# Patient Record
Sex: Female | Born: 1975
Health system: Southern US, Community
[De-identification: ages and names within clinical notes are randomized; demographics above are authoritative.]

## PROBLEM LIST (undated history)

## (undated) DIAGNOSIS — F988 Other specified behavioral and emotional disorders with onset usually occurring in childhood and adolescence: Secondary | ICD-10-CM

## (undated) DIAGNOSIS — E039 Hypothyroidism, unspecified: Secondary | ICD-10-CM

## (undated) DIAGNOSIS — F319 Bipolar disorder, unspecified: Secondary | ICD-10-CM

## (undated) DIAGNOSIS — F419 Anxiety disorder, unspecified: Secondary | ICD-10-CM

## (undated) DIAGNOSIS — F329 Major depressive disorder, single episode, unspecified: Secondary | ICD-10-CM

## (undated) DIAGNOSIS — I10 Essential (primary) hypertension: Secondary | ICD-10-CM

## (undated) DIAGNOSIS — F32A Depression, unspecified: Secondary | ICD-10-CM

## (undated) DIAGNOSIS — E7800 Pure hypercholesterolemia, unspecified: Secondary | ICD-10-CM

## (undated) HISTORY — DX: Hypothyroidism, unspecified: E03.9

## (undated) HISTORY — DX: Anxiety disorder, unspecified: F41.9

## (undated) HISTORY — PX: HAND EXPLORATION: SHX1725

## (undated) HISTORY — PX: CERVIX SURGERY: SHX593

## (undated) HISTORY — DX: Essential (primary) hypertension: I10

## (undated) HISTORY — DX: Depression, unspecified: F32.A

## (undated) HISTORY — PX: MANDIBLE SURGERY: SHX707

## (undated) HISTORY — DX: Bipolar disorder, unspecified: F31.9

## (undated) HISTORY — DX: Other specified behavioral and emotional disorders with onset usually occurring in childhood and adolescence: F98.8

## (undated) HISTORY — PX: APPENDECTOMY: SHX54

## (undated) HISTORY — DX: Major depressive disorder, single episode, unspecified: F32.9

---
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
## 2019-01-18 ENCOUNTER — Encounter: Payer: Self-pay | Admitting: Gastroenterology

## 2019-03-22 ENCOUNTER — Ambulatory Visit (INDEPENDENT_AMBULATORY_CARE_PROVIDER_SITE_OTHER): Payer: Medicaid Other | Admitting: Gastroenterology

## 2019-03-22 ENCOUNTER — Encounter: Payer: Self-pay | Admitting: Gastroenterology

## 2019-03-22 ENCOUNTER — Other Ambulatory Visit: Payer: Self-pay

## 2019-03-22 DIAGNOSIS — K625 Hemorrhage of anus and rectum: Principal | ICD-10-CM

## 2019-03-22 DIAGNOSIS — K5900 Constipation, unspecified: Secondary | ICD-10-CM

## 2019-03-22 DIAGNOSIS — R945 Abnormal results of liver function studies: Secondary | ICD-10-CM

## 2019-03-22 DIAGNOSIS — R7989 Other specified abnormal findings of blood chemistry: Secondary | ICD-10-CM

## 2019-03-22 NOTE — Progress Notes (Signed)
CC'ED TO PCP 

## 2019-03-22 NOTE — Progress Notes (Signed)
Primary Care Physician:  Rebecka Apley, NP Primary GI:  Roetta Sessions, MD    Patient Location: Home  Provider Location: [facility] office  Reason for Visit: constipation  Persons present on the virtual encounter, with roles: Patient, myself Linford Arnold, LPN (updated meds and allergies)  Total time (minutes) spent on medical discussion: 25 minutes  Due to COVID-19, visit was conducted using [email or web address] method.  Visit was requested by patient.  Virtual Visit via [email or web address]  I connected with Janyth Pupa on 03/22/19 at 10:00 AM EDT by [email or web address] and verified that I am speaking with the correct person using two identifiers.   I discussed the limitations, risks, security and privacy concerns of performing an evaluation and management service by telephone/video and the availability of in person appointments. I also discussed with the patient that there may be a patient responsible charge related to this service. The patient expressed understanding and agreed to proceed.  Chief Complaint  Patient presents with  . Constipation    HPI:   Zuzu Befort is a 43 y.o. female who presents for virtual visit regarding constipation, rectal bleeding at the request of Sharon Seller, NP.  Patient complains of chronic constipation for years.  She has been having frequent bright red blood per rectum for the past 2 years.  Currently on Linzess daily.  Some days has frequently stools, other days does okay.  Does not want to change dose. No abdominal pain. No heartburn. Appetite too good. No vomiting.  No weight loss.  Reports weighing around 210 pounds.  She drinks alcohol about 3 times per week.  May consume 7 to 8 twelve ounce beers at a time.  Recent labs showed mild elevation of ALT.  She denies previous viral markers. No blood transfusion. No tattoos. No drug use.  No family history of liver disease or colon cancer.  No prior colonoscopy.  Current Outpatient Medications   Medication Sig Dispense Refill  . ALPRAZolam (XANAX XR) 1 MG 24 hr tablet Take 1 mg by mouth daily.    Marland Kitchen ALPRAZolam (XANAX) 1 MG tablet Take 1 tablet by mouth as needed.    . desvenlafaxine (PRISTIQ) 100 MG 24 hr tablet Take 1 tablet by mouth 2 (two) times daily.    Marland Kitchen levothyroxine (SYNTHROID) 112 MCG tablet Take 1 tablet by mouth daily.    Marland Kitchen linaclotide (LINZESS) 145 MCG CAPS capsule Take 1 capsule by mouth daily.    Marland Kitchen lisinopril-hydrochlorothiazide (ZESTORETIC) 20-12.5 MG tablet Take 1 tablet by mouth daily.    . QUEtiapine (SEROQUEL) 100 MG tablet Take 600 mg by mouth daily.    . simvastatin (ZOCOR) 20 MG tablet Take 1 tablet by mouth daily.    . Vitamin D, Ergocalciferol, (DRISDOL) 1.25 MG (50000 UT) CAPS capsule Take 1 capsule by mouth once a week.     No current facility-administered medications for this visit.     Past Medical History:  Diagnosis Date  . ADD (attention deficit disorder)   . Anxiety   . Bipolar 1 disorder (HCC)   . Depression   . HTN (hypertension)   . Hypothyroidism     Past Surgical History:  Procedure Laterality Date  . CERVIX SURGERY    . MANDIBLE SURGERY      Family History  Problem Relation Age of Onset  . Breast cancer Mother   . Liver disease Neg Hx   . Colon cancer Neg Hx     Social History   Socioeconomic History  .  Marital status: Not on file    Spouse name: Not on file  . Number of children: Not on file  . Years of education: Not on file  . Highest education level: Not on file  Occupational History  . Not on file  Social Needs  . Financial resource strain: Not on file  . Food insecurity:    Worry: Not on file    Inability: Not on file  . Transportation needs:    Medical: Not on file    Non-medical: Not on file  Tobacco Use  . Smoking status: Current Every Day Smoker    Types: Cigarettes  . Smokeless tobacco: Never Used  Substance and Sexual Activity  . Alcohol use: Yes    Comment: 3 times/weekly. 7-8 12 ounces beers  .  Drug use: Not Currently  . Sexual activity: Not on file  Lifestyle  . Physical activity:    Days per week: Not on file    Minutes per session: Not on file  . Stress: Not on file  Relationships  . Social connections:    Talks on phone: Not on file    Gets together: Not on file    Attends religious service: Not on file    Active member of club or organization: Not on file    Attends meetings of clubs or organizations: Not on file    Relationship status: Not on file  . Intimate partner violence:    Fear of current or ex partner: Not on file    Emotionally abused: Not on file    Physically abused: Not on file    Forced sexual activity: Not on file  Other Topics Concern  . Not on file  Social History Narrative  . Not on file      ROS:  General: Negative for anorexia, weight loss, fever, chills, fatigue, weakness. Eyes: Negative for vision changes.  ENT: Negative for hoarseness, difficulty swallowing , nasal congestion. CV: Negative for chest pain, angina, palpitations, dyspnea on exertion, peripheral edema.  Respiratory: Negative for dyspnea at rest, dyspnea on exertion, cough, sputum, wheezing.  GI: See history of present illness. GU:  Negative for dysuria, hematuria, urinary incontinence, urinary frequency, nocturnal urination.  MS: Negative for joint pain, low back pain.  Derm: Negative for rash or itching.  Neuro: Negative for weakness, abnormal sensation, seizure, frequent headaches, memory loss, confusion.  Psych: Negative for anxiety, depression, suicidal ideation, hallucinations.  Endo: Negative for unusual weight change.  Heme: Negative for bruising or bleeding. Allergy: Negative for rash or hives.   Observations/Objective: Pleasant well-nourished well-built Caucasian female in no acute distress.  She reports weighing 210 pounds.  In February she weighed 216 pounds at her PCP office.  Otherwise exam unavailable.  Labs from 01/07/2019: Glucose 99, BUN 9, creatinine  0.79, sodium 137, potassium 4.4, albumin 4.5, total bilirubin 0.3, alkaline phosphatase 110, AST 26, ALT 37, A1c 5.6, TSH 3.5.  Labs from August 2019, hemoglobin 14.3, hematocrit 42.4, platelets 3 54,000, white blood cell count 7400  Assessment and Plan: Pleasant 43 year old female presenting with chronic constipation, chronic hematochezia.  Currently constipation under control with Linzess 145 mcg daily.  Continues to have frequent bright red blood per rectum regardless of having loose or hard stools.  No family history of colon cancer.  Will recommend colonoscopy for further evaluation.  Plan for deep sedation given history of polypharmacy and alcohol use.  I have discussed the risks, alternatives, benefits with regards to but not limited to the risk  of reaction to medication, bleeding, infection, perforation and the patient is agreeable to proceed. Written consent to be obtained.  Minimally elevated ALT.  Possibly related to medications.  Patient admits to significant alcohol use about 3 times per week.  She appears low risk for viral hepatitis but would like to be checked.  We will place orders for hepatitis B surface antigen, hepatitis B total core antibody, hepatitis C antibody, labs to screen for hemochromatosis.  She would like to get these done at her PCP office which is a local for her.  We will make contact with him to see if this is an option and we have sent her copies of the labs being requested.  Follow Up Instructions:    I discussed the assessment and treatment plan with the patient. The patient was provided an opportunity to ask questions and all were answered. The patient agreed with the plan and demonstrated an understanding of the instructions. AVS mailed to patient's home address.   The patient was advised to call back or seek an in-person evaluation if the symptoms worsen or if the condition fails to improve as anticipated.  I provided 25 minutes of virtual face-to-face time  during this encounter.   Tana CoastLeslie Justin Buechner, PA-C

## 2019-03-22 NOTE — Patient Instructions (Signed)
1. Colonoscopy to be scheduled. See separate instructions.  2. Please take these lab orders to your PCP and see if they will draw them for you. We will also make contact with their office to see if they can draw them. Please call us at [phone] once you have had them done so we can make sure they send [place] a copy of results.  3. Please cut back on your alcohol use. More than 24 ounces of beer in 24 hours is likely too much for your liver to break down and you run risk of developing permanent liver damage.

## 2019-03-23 ENCOUNTER — Telehealth: Payer: Self-pay

## 2019-03-23 NOTE — Telephone Encounter (Signed)
Tried to call pt, no answer 

## 2019-03-23 NOTE — Telephone Encounter (Signed)
Tried to call pt to schedule TCS w/Propofol w/RMR (ok to schedule urgent per LSL), no answer, no VM set-up.

## 2019-03-29 ENCOUNTER — Other Ambulatory Visit: Payer: Self-pay

## 2019-03-29 DIAGNOSIS — K625 Hemorrhage of anus and rectum: Secondary | ICD-10-CM

## 2019-03-29 MED ORDER — CLENPIQ 10-3.5-12 MG-GM -GM/160ML PO SOLN: 1.0000 | Freq: Once | ORAL | 0 refills | Status: AC

## 2019-03-29 NOTE — Telephone Encounter (Signed)
Called pt, TCS w/Propofol w/RMR scheduled for 05/10/19 (pt unable to do procedure next week) at 2:30pm. Rx for prep sent to pharmacy. Orders entered.

## 2019-04-05 NOTE — Telephone Encounter (Signed)
Pre-op appt 05/06/19 at 8:00am. Appt letter mailed with procedure instructions.

## 2019-05-03 NOTE — Patient Instructions (Signed)
   Your procedure is scheduled on: 05/10/2019  Report to [facility] at     1:00 PM.  Call this number if you have problems the morning of surgery: [phone]   Remember:              Follow Directions on the letter you received from Your Physician's office regarding the Bowel Prep  :  Take these medicines the morning of surgery with A SIP OF WATER: Xanax, Synthroid, Seroquel, and Pristiq   Do not wear jewelry, make-up or nail polish.    Do not bring valuables to the hospital.  Contacts, dentures or bridgework may not be worn into surgery.  .   Patients discharged the day of surgery will not be allowed to drive home.     Colonoscopy, Adult, Care After This sheet gives you information about how to care for yourself after your procedure. Your health care provider may also give you more specific instructions. If you have problems or questions, contact your health care provider. What can I expect after the procedure? After the procedure, it is common to have:  A small amount of blood in your stool for 24 hours after the procedure.  Some gas.  Mild abdominal cramping or bloating.  Follow these instructions at home: General instructions   For the first 24 hours after the procedure: ? Do not drive or use machinery. ? Do not sign important documents. ? Do not drink alcohol. ? Do your regular daily activities at a slower pace than normal. ? Eat soft, easy-to-digest foods. ? Rest often.  Take over-the-counter or prescription medicines only as told by your health care provider.  It is up to you to get the results of your procedure. Ask your health care provider, or the department performing the procedure, when your results will be ready. Relieving cramping and bloating  Try walking around when you have cramps or feel bloated.  Apply heat to your abdomen as told by your health care provider. Use a heat source that your health care provider recommends, such as a moist heat pack or  a heating pad. ? Place a towel between your skin and the heat source. ? Leave the heat on for 20-30 minutes. ? Remove the heat if your skin turns bright red. This is especially important if you are unable to feel pain, heat, or cold. You may have a greater risk of getting burned. Eating and drinking  Drink enough fluid to keep your urine clear or pale yellow.  Resume your normal diet as instructed by your health care provider. Avoid heavy or fried foods that are hard to digest.  Avoid drinking alcohol for as long as instructed by your health care provider. Contact a health care provider if:  You have blood in your stool 2-3 days after the procedure. Get help right away if:  You have more than a small spotting of blood in your stool.  You pass large blood clots in your stool.  Your abdomen is swollen.  You have nausea or vomiting.  You have a fever.  You have increasing abdominal pain that is not relieved with medicine. This information is not intended to replace advice given to you by your health care provider. Make sure you discuss any questions you have with your health care provider. Document Released: 06/18/2004 Document Revised: 07/29/2016 Document Reviewed: 01/16/2016 Elsevier Interactive Patient Education  [employer].

## 2019-05-06 ENCOUNTER — Other Ambulatory Visit (HOSPITAL_COMMUNITY)
Admission: RE | Admit: 2019-05-06 | Discharge: 2019-05-06 | Disposition: A | Discharge status: Home / Self Care | Payer: Medicaid Other | Source: Ambulatory Visit | Attending: Internal Medicine | Admitting: Internal Medicine

## 2019-05-06 ENCOUNTER — Other Ambulatory Visit: Payer: Self-pay

## 2019-05-06 ENCOUNTER — Encounter (HOSPITAL_COMMUNITY): Payer: Self-pay

## 2019-05-06 ENCOUNTER — Encounter (HOSPITAL_COMMUNITY)
Admission: RE | Admit: 2019-05-06 | Discharge: 2019-05-06 | Disposition: A | Discharge status: Home / Self Care | Payer: Medicaid Other | Source: Ambulatory Visit | Attending: Internal Medicine | Admitting: Internal Medicine

## 2019-05-06 ENCOUNTER — Telehealth: Payer: Self-pay

## 2019-05-06 DIAGNOSIS — Z01812 Encounter for preprocedural laboratory examination: Secondary | ICD-10-CM | POA: Insufficient documentation

## 2019-05-06 DIAGNOSIS — Z1159 Encounter for screening for other viral diseases: Secondary | ICD-10-CM | POA: Insufficient documentation

## 2019-05-06 HISTORY — DX: Pure hypercholesterolemia, unspecified: E78.00

## 2019-05-06 LAB — BASIC METABOLIC PANEL
Anion gap: 14 (ref 5–15)
BUN: 10 mg/dL (ref 6–20)
CO2: 18 mmol/L — ABNORMAL LOW (ref 22–32)
Calcium: 9.4 mg/dL (ref 8.9–10.3)
Chloride: 103 mmol/L (ref 98–111)
Creatinine, Ser: 0.71 mg/dL (ref 0.44–1.00)
GFR calc Af Amer: >60 mL/min (ref >60)
GFR calc non Af Amer: >60 mL/min (ref >60)
Glucose, Bld: 116 mg/dL — ABNORMAL HIGH (ref 70–99)
Potassium: 3.9 mmol/L (ref 3.5–5.1)
Sodium: 135 mmol/L (ref 135–145)

## 2019-05-06 LAB — HCG, SERUM, QUALITATIVE: Preg, Serum: NEGATIVE

## 2019-05-06 NOTE — Telephone Encounter (Signed)
Called pt to see if she could arrive 05/10/19 at 6:45am for TCS at 8:15am. Pt wanted to keep procedure time as already scheduled. LMOVM for endo scheduler.

## 2019-05-07 LAB — NOVEL CORONAVIRUS, NAA (HOSP ORDER, SEND-OUT TO REF LAB; TAT 18-24 HRS): SARS-CoV-2, NAA: NOT DETECTED

## 2019-05-10 ENCOUNTER — Ambulatory Visit (HOSPITAL_COMMUNITY): Payer: Medicaid Other | Admitting: Certified Registered Nurse Anesthetist

## 2019-05-10 ENCOUNTER — Other Ambulatory Visit: Payer: Self-pay

## 2019-05-10 ENCOUNTER — Encounter (HOSPITAL_COMMUNITY): Payer: Self-pay

## 2019-05-10 ENCOUNTER — Encounter (HOSPITAL_COMMUNITY)
Admission: RE | Disposition: A | Discharge status: Home / Self Care | Payer: Self-pay | Source: Home / Self Care | Attending: Internal Medicine

## 2019-05-10 ENCOUNTER — Ambulatory Visit (HOSPITAL_COMMUNITY)
Admission: RE | Admit: 2019-05-10 | Discharge: 2019-05-10 | Disposition: A | Discharge status: Home / Self Care | Payer: Medicaid Other | Attending: Internal Medicine | Admitting: Internal Medicine

## 2019-05-10 DIAGNOSIS — F319 Bipolar disorder, unspecified: Secondary | ICD-10-CM | POA: Diagnosis not present

## 2019-05-10 DIAGNOSIS — K921 Melena: Secondary | ICD-10-CM | POA: Diagnosis not present

## 2019-05-10 DIAGNOSIS — E039 Hypothyroidism, unspecified: Secondary | ICD-10-CM | POA: Insufficient documentation

## 2019-05-10 DIAGNOSIS — F419 Anxiety disorder, unspecified: Secondary | ICD-10-CM | POA: Diagnosis not present

## 2019-05-10 DIAGNOSIS — I10 Essential (primary) hypertension: Secondary | ICD-10-CM | POA: Insufficient documentation

## 2019-05-10 DIAGNOSIS — K644 Residual hemorrhoidal skin tags: Secondary | ICD-10-CM | POA: Insufficient documentation

## 2019-05-10 DIAGNOSIS — K5900 Constipation, unspecified: Secondary | ICD-10-CM | POA: Diagnosis not present

## 2019-05-10 DIAGNOSIS — Z79899 Other long term (current) drug therapy: Secondary | ICD-10-CM | POA: Diagnosis not present

## 2019-05-10 DIAGNOSIS — F17210 Nicotine dependence, cigarettes, uncomplicated: Secondary | ICD-10-CM | POA: Insufficient documentation

## 2019-05-10 DIAGNOSIS — Z79890 Hormone replacement therapy (postmenopausal): Secondary | ICD-10-CM | POA: Insufficient documentation

## 2019-05-10 DIAGNOSIS — K641 Second degree hemorrhoids: Secondary | ICD-10-CM | POA: Diagnosis not present

## 2019-05-10 DIAGNOSIS — K625 Hemorrhage of anus and rectum: Secondary | ICD-10-CM

## 2019-05-10 HISTORY — PX: COLONOSCOPY WITH PROPOFOL: SHX5780

## 2019-05-10 SURGERY — COLONOSCOPY WITH PROPOFOL
Anesthesia: Monitor Anesthesia Care

## 2019-05-10 MED ORDER — MEPERIDINE HCL 50 MG/ML IJ SOLN: 6.2500 mg | INTRAMUSCULAR | Status: DC | PRN

## 2019-05-10 MED ORDER — HYDROMORPHONE HCL 1 MG/ML IJ SOLN: 0.2500 mg | INTRAMUSCULAR | Status: DC | PRN

## 2019-05-10 MED ORDER — CHLORHEXIDINE GLUCONATE CLOTH 2 % EX PADS: 6.0000 | MEDICATED_PAD | Freq: Once | CUTANEOUS | Status: DC

## 2019-05-10 MED ORDER — PROMETHAZINE HCL 25 MG/ML IJ SOLN: 6.2500 mg | INTRAMUSCULAR | Status: DC | PRN

## 2019-05-10 MED ORDER — KETAMINE HCL 10 MG/ML IJ SOLN
INTRAMUSCULAR | Status: DC | PRN
  Administered 2019-05-10 (×2): 10 mg via INTRAVENOUS

## 2019-05-10 MED ORDER — LACTATED RINGERS IV SOLN
INTRAVENOUS | Status: DC
  Administered 2019-05-10: 14:00:00 via INTRAVENOUS

## 2019-05-10 MED ORDER — HYDROCODONE-ACETAMINOPHEN 7.5-325 MG PO TABS: 1.0000 | ORAL_TABLET | Freq: Once | ORAL | Status: DC | PRN

## 2019-05-10 MED ORDER — LACTATED RINGERS IV SOLN: INTRAVENOUS | Status: DC

## 2019-05-10 MED ORDER — PROPOFOL 500 MG/50ML IV EMUL
INTRAVENOUS | Status: DC | PRN
  Administered 2019-05-10: 14:00:00 via INTRAVENOUS
  Administered 2019-05-10: 150 ug/kg/min via INTRAVENOUS

## 2019-05-10 MED ORDER — PROPOFOL 10 MG/ML IV BOLUS
INTRAVENOUS | Status: DC | PRN
  Administered 2019-05-10: 20 mg via INTRAVENOUS
  Administered 2019-05-10: 30 mg via INTRAVENOUS
  Administered 2019-05-10: 20 mg via INTRAVENOUS
  Administered 2019-05-10: 30 mg via INTRAVENOUS

## 2019-05-10 NOTE — Transfer of Care (Signed)
Immediate Anesthesia Transfer of Care Note  Patient: Evelyn Simpson  Procedure(s) Performed: COLONOSCOPY WITH PROPOFOL (N/A )  Patient Location: PACU  Anesthesia Type:MAC  Level of Consciousness: awake and alert   Airway & Oxygen Therapy: Patient Spontanous Breathing  Post-op Assessment: Report given to RN  Post vital signs: Reviewed  Last Vitals:  Vitals Value Taken Time  BP 110/53 05/10/19 1415  Temp    Pulse 96 05/10/19 1418  Resp 20 05/10/19 1418  SpO2 99 % 05/10/19 1418  Vitals shown include unvalidated device data.  Last Pain:  Vitals:   05/10/19 1350  TempSrc:   PainSc: 0-No pain         Complications: No apparent anesthesia complications

## 2019-05-10 NOTE — Discharge Instructions (Signed)
Colonoscopy Discharge Instructions  Read the instructions outlined below and refer to this sheet in the next few weeks. These discharge instructions provide you with general information on caring for yourself after you leave the hospital. Your doctor may also give you specific instructions. While your treatment has been planned according to the most current medical practices available, unavoidable complications occasionally occur. If you have any problems or questions after discharge, call Dr. Jena Gaussourk at [phone]. ACTIVITY  You may resume your regular activity, but move at a slower pace for the next 24 hours.   Take frequent rest periods for the next 24 hours.   Walking will help get rid of the air and reduce the bloated feeling in your belly (abdomen).   No driving for 24 hours (because of the medicine (anesthesia) used during the test).    Do not sign any important legal documents or operate any machinery for 24 hours (because of the anesthesia used during the test).  NUTRITION  Drink plenty of fluids.   You may resume your normal diet as instructed by your doctor.   Begin with a light meal and progress to your normal diet. Heavy or fried foods are harder to digest and may make you feel sick to your stomach (nauseated).   Avoid alcoholic beverages for 24 hours or as instructed.  MEDICATIONS  You may resume your normal medications unless your doctor tells you otherwise.  WHAT YOU CAN EXPECT TODAY  Some feelings of bloating in the abdomen.   Passage of more gas than usual.   Spotting of blood in your stool or on the toilet paper.  IF YOU HAD POLYPS REMOVED DURING THE COLONOSCOPY:  No aspirin products for 7 days or as instructed.   No alcohol for 7 days or as instructed.   Eat a soft diet for the next 24 hours.  FINDING OUT THE RESULTS OF YOUR TEST Not all test results are available during your visit. If your test results are not back during the visit, make an appointment  with your caregiver to find out the results. Do not assume everything is normal if you have not heard from your caregiver or the medical facility. It is important for you to follow up on all of your test results.  SEEK IMMEDIATE MEDICAL ATTENTION IF:  You have more than a spotting of blood in your stool.   Your belly is swollen (abdominal distention).   You are nauseated or vomiting.   You have a temperature over 101.   You have abdominal pain or discomfort that is severe or gets worse throughout the day.   Constipation information provided  Hemorrhoid information provided  Increase Linzess to 290 daily-new prescription provided  Repeat colonoscopy in 10 years for screening purposes  Office visit with us in 3 months  I have discussed findings and recommendations at patient's request with her parents.      Hemorrhoids Hemorrhoids are swollen veins in and around the rectum or anus. There are two types of hemorrhoids:  Internal hemorrhoids. These occur in the veins that are just inside the rectum. They may poke through to the outside and become irritated and painful.  External hemorrhoids. These occur in the veins that are outside the anus and can be felt as a painful swelling or hard lump near the anus. Most hemorrhoids do not cause serious problems, and they can be managed with home treatments such as diet and lifestyle changes. If home treatments do not help the symptoms, procedures  can be done to shrink or remove the hemorrhoids. What are the causes? This condition is caused by increased pressure in the anal area. This pressure may result from various things, including:  Constipation.  Straining to have a bowel movement.  Diarrhea.  Pregnancy.  Obesity.  Sitting for long periods of time.  Heavy lifting or other activity that causes you to strain.  Anal sex.  Riding a bike for a long period of time. What are the signs or symptoms? Symptoms of this condition  include:  Pain.  Anal itching or irritation.  Rectal bleeding.  Leakage of stool (feces).  Anal swelling.  One or more lumps around the anus. How is this diagnosed? This condition can often be diagnosed through a visual exam. Other exams or tests may also be done, such as:  An exam that involves feeling the rectal area with a gloved hand (digital rectal exam).  An exam of the anal canal that is done using a small tube (anoscope).  A blood test, if you have lost a significant amount of blood.  A test to look inside the colon using a flexible tube with a camera on the end (sigmoidoscopy or colonoscopy). How is this treated? This condition can usually be treated at home. However, various procedures may be done if dietary changes, lifestyle changes, and other home treatments do not help your symptoms. These procedures can help make the hemorrhoids smaller or remove them completely. Some of these procedures involve surgery, and others do not. Common procedures include:  Rubber band ligation. Rubber bands are placed at the base of the hemorrhoids to cut off their blood supply.  Sclerotherapy. Medicine is injected into the hemorrhoids to shrink them.  Infrared coagulation. A type of light energy is used to get rid of the hemorrhoids.  Hemorrhoidectomy surgery. The hemorrhoids are surgically removed, and the veins that supply them are tied off.  Stapled hemorrhoidopexy surgery. The surgeon staples the base of the hemorrhoid to the rectal wall. Follow these instructions at home: Eating and drinking   Eat foods that have a lot of fiber in them, such as whole grains, beans, nuts, fruits, and vegetables.  Ask your health care provider about taking products that have added fiber (fiber supplements).  Reduce the amount of fat in your diet. You can do this by eating low-fat dairy products, eating less red meat, and avoiding processed foods.  Drink enough fluid to keep your urine pale  yellow. Managing pain and swelling   Take warm sitz baths for 20 minutes, 3-4 times a day to ease pain and discomfort. You may do this in a bathtub or using a portable sitz bath that fits over the toilet.  If directed, apply ice to the affected area. Using ice packs between sitz baths may be helpful. ? Put ice in a plastic bag. ? Place a towel between your skin and the bag. ? Leave the ice on for 20 minutes, 2-3 times a day. General instructions  Take over-the-counter and prescription medicines only as told by your health care provider.  Use medicated creams or suppositories as told.  Get regular exercise. Ask your health care provider how much and what kind of exercise is best for you. In general, you should do moderate exercise for at least 30 minutes on most days of the week (150 minutes each week). This can include activities such as walking, biking, or yoga.  Go to the bathroom when you have the urge to have a bowel  movement. Do not wait.  Avoid straining to have bowel movements.  Keep the anal area dry and clean. Use wet toilet paper or moist towelettes after a bowel movement.  Do not sit on the toilet for long periods of time. This increases blood pooling and pain.  Keep all follow-up visits as told by your health care provider. This is important. Contact a health care provider if you have:  Increasing pain and swelling that are not controlled by treatment or medicine.  Difficulty having a bowel movement, or you are unable to have a bowel movement.  Pain or inflammation outside the area of the hemorrhoids. Get help right away if you have:  Uncontrolled bleeding from your rectum. Summary  Hemorrhoids are swollen veins in and around the rectum or anus.  Most hemorrhoids can be managed with home treatments such as diet and lifestyle changes.  Taking warm sitz baths can help ease pain and discomfort.  In severe cases, procedures or surgery can be done to shrink or  remove the hemorrhoids. This information is not intended to replace advice given to you by your health care provider. Make sure you discuss any questions you have with your health care provider. Document Released: 11/01/2000 Document Revised: 03/26/2018 Document Reviewed: 03/26/2018 Elsevier Interactive Patient Education  2019 [employer]     Monitored Anesthesia Care, Care After These instructions provide you with information about caring for yourself after your procedure. Your health care provider may also give you more specific instructions. Your treatment has been planned according to current medical practices, but problems sometimes occur. Call your health care provider if you have any problems or questions after your procedure. What can I expect after the procedure? After your procedure, you may:  Feel sleepy for several hours.  Feel clumsy and have poor balance for several hours.  Feel forgetful about what happened after the procedure.  Have poor judgment for several hours.  Feel nauseous or vomit.  Have a sore throat if you had a breathing tube during the procedure. Follow these instructions at home: For at least 24 hours after the procedure:      Have a responsible adult stay with you. It is important to have someone help care for you until you are awake and alert.  Rest as needed.  Do not: ? Participate in activities in which you could fall or become injured. ? Drive. ? Use heavy machinery. ? Drink alcohol. ? Take sleeping pills or medicines that cause drowsiness. ? Make important decisions or sign legal documents. ? Take care of children on your own. Eating and drinking  Follow the diet that is recommended by your health care provider.  If you vomit, drink water, juice, or soup when you can drink without vomiting.  Make sure you have little or no nausea before eating solid foods. General instructions  Take over-the-counter and prescription medicines  only as told by your health care provider.  If you have sleep apnea, surgery and certain medicines can increase your risk for breathing problems. Follow instructions from your health care provider about wearing your sleep device: ? Anytime you are sleeping, including during daytime naps. ? While taking prescription pain medicines, sleeping medicines, or medicines that make you drowsy.  If you smoke, do not smoke without supervision.  Keep all follow-up visits as told by your health care provider. This is important. Contact a health care provider if:  You keep feeling nauseous or you keep vomiting.  You feel light-headed.  You  develop a rash.  You have a fever. Get help right away if:  You have trouble breathing. Summary  For several hours after your procedure, you may feel sleepy and have poor judgment.  Have a responsible adult stay with you for at least 24 hours or until you are awake and alert. This information is not intended to replace advice given to you by your health care provider. Make sure you discuss any questions you have with your health care provider. Document Released: 02/25/2016 Document Revised: 06/20/2017 Document Reviewed: 02/25/2016 Elsevier Interactive Patient Education  2019 [employer].    Constipation, Adult Constipation is when a person has fewer bowel movements in a week than normal, has difficulty having a bowel movement, or has stools that are dry, hard, or larger than normal. Constipation may be caused by an underlying condition. It may become worse with age if a person takes certain medicines and does not take in enough fluids. Follow these instructions at home: Eating and drinking   Eat foods that have a lot of fiber, such as fresh fruits and vegetables, whole grains, and beans.  Limit foods that are high in fat, low in fiber, or overly processed, such as french fries, hamburgers, cookies, candies, and soda.  Drink enough fluid to keep your  urine clear or pale yellow. General instructions  Exercise regularly or as told by your health care provider.  Go to the restroom when you have the urge to go. Do not hold it in.  Take over-the-counter and prescription medicines only as told by your health care provider. These include any fiber supplements.  Practice pelvic floor retraining exercises, such as deep breathing while relaxing the lower abdomen and pelvic floor relaxation during bowel movements.  Watch your condition for any changes.  Keep all follow-up visits as told by your health care provider. This is important. Contact a health care provider if:  You have pain that gets worse.  You have a fever.  You do not have a bowel movement after 4 days.  You vomit.  You are not hungry.  You lose weight.  You are bleeding from the anus.  You have thin, pencil-like stools. Get help right away if:  You have a fever and your symptoms suddenly get worse.  You leak stool or have blood in your stool.  Your abdomen is bloated.  You have severe pain in your abdomen.  You feel dizzy or you faint. This information is not intended to replace advice given to you by your health care provider. Make sure you discuss any questions you have with your health care provider. Document Released: 08/02/2004 Document Revised: 05/24/2016 Document Reviewed: 04/24/2016 Elsevier Interactive Patient Education  2019 [employer].

## 2019-05-10 NOTE — Op Note (Signed)
[facility] Patient Name: Evelyn Simpson Nissen Procedure Date: 05/10/2019 1:34 PM MRN: [id number] Date of Birth: 01/05/1976 Attending MD: Gennette Pacobert Michael Selenne Coggin , MD CSN: [id number] Age: 43 Admit Type: Outpatient Procedure:                Colonoscopy Indications:              Hematochezia Providers:                Gennette Pacobert Michael Jenella Craigie, MD, Criselda PeachesLurae B. Patsy LagerAlbert RN, RN,                            Pandora LeiterNeville David, Technician Referring MD:              Medicines:                Propofol per Anesthesia Complications:            No immediate complications. Estimated Blood Loss:     Estimated blood loss: none. Procedure:                Pre-Anesthesia Assessment:                           - Prior to the procedure, a History and Physical                            was performed, and patient medications and                            allergies were reviewed. The patient's tolerance of                            previous anesthesia was also reviewed. The risks                            and benefits of the procedure and the sedation                            options and risks were discussed with the patient.                            All questions were answered, and informed consent                            was obtained. Prior Anticoagulants: The patient has                            taken no previous anticoagulant or antiplatelet                            agents. ASA Grade Assessment: III - A patient with                            severe systemic disease. After reviewing the risks  and benefits, the patient was deemed in                            satisfactory condition to undergo the procedure.                           After obtaining informed consent, the colonoscope                            was passed under direct vision. Throughout the                            procedure, the patient's blood pressure, pulse, and                            oxygen saturations were  monitored continuously. The                            CF-[id number] ([id number] scope was introduced through                            the anus and advanced to the the cecum, identified                            by appendiceal orifice and ileocecal valve. The                            colonoscopy was performed without difficulty. The                            patient tolerated the procedure well. The quality                            of the bowel preparation was adequate. Scope In: 1:57:38 PM Scope Out: 2:09:57 PM Scope Withdrawal Time: 0 hours 7 minutes 17 seconds  Total Procedure Duration: 0 hours 12 minutes 19 seconds  Findings:      The perianal and digital rectal examinations were normal.      The colon (entire examined portion) appeared normal.      Non-bleeding external and internal hemorrhoids were found during       retroflexion. The hemorrhoids were mild, small and Grade II (internal       hemorrhoids that prolapse but reduce spontaneously).      The exam was otherwise without abnormality on direct and retroflexion       views. Impression:               - The entire examined colon is normal.                           - Non-bleeding external and internal hemorrhoids.                           - The examination was otherwise normal on direct  and retroflexion views.                           - No specimens collected. I suspect trivial                            bleeding from hemorrhoids in the setting of                            constipation. Moderate Sedation:      Moderate (conscious) sedation was personally administered by an       anesthesia professional. The following parameters were monitored: oxygen       saturation, heart rate, blood pressure, and response to care. Recommendation:           - Patient has a contact number available for                            emergencies. The signs and symptoms of potential                             delayed complications were discussed with the                            patient. Return to normal activities tomorrow.                            Written discharge instructions were provided to the                            patient.                           - Resume previous diet.                           - Continue present medications. Increase Linzess to                            290 daily                           - Repeat colonoscopy in 10 years for screening                            purposes.                           - Return to GI office in 3 months. Procedure Code(s):        --- Professional ---                           [phone], Colonoscopy, flexible; diagnostic, including                            collection of specimen(s) by brushing or washing,  when performed (separate procedure) Diagnosis Code(s):        --- Professional ---                           K64.1, Second degree hemorrhoids                           K92.1, Melena (includes Hematochezia) CPT copyright 2019 American Medical Association. All rights reserved. The codes documented in this report are preliminary and upon coder review may  be revised to meet current compliance requirements. Gerrit Friendsobert M. Sheranda Seabrooks, MD Gennette Pacobert Michael Nene Aranas, MD 05/10/2019 2:17:21 PM This report has been signed electronically. Number of Addenda: 0

## 2019-05-10 NOTE — H&P (Signed)
@LOGO @   Primary Care Physician:  Bridget Hartshorn, NP Primary Gastroenterologist:  Dr. Gala Romney  Pre-Procedure History & Physical: HPI:  Evelyn Simpson is a 43 y.o. female here for a colonoscopy to further evaluate intermittent rectal bleeding in the setting of constipation.  May go 5 days without a bowel movement in spite of taking Linzess 145 daily.  Past Medical History:  Diagnosis Date  . ADD (attention deficit disorder)   . Anxiety   . Bipolar 1 disorder ([place])   . Depression   . HTN (hypertension)   . Hypercholesteremia   . Hypothyroidism     Past Surgical History:  Procedure Laterality Date  . CERVIX SURGERY    . HAND EXPLORATION Left    removal of glass from finger  . MANDIBLE SURGERY      Prior to Admission medications   Medication Sig Start Date End Date Taking? Authorizing Provider  ALPRAZolam (XANAX XR) 1 MG 24 hr tablet Take 1 mg by mouth every morning.    Yes [provider]  ALPRAZolam Duanne Moron) 1 MG tablet Take 1 tablet by mouth every evening.    Yes [provider]  desvenlafaxine (PRISTIQ) 100 MG 24 hr tablet Take 100 mg by mouth 2 (two) times daily.    Yes [provider]  levothyroxine (SYNTHROID) 112 MCG tablet Take 112 mcg by mouth daily before breakfast.  01/19/19  Yes [provider]  linaclotide (LINZESS) 145 MCG CAPS capsule Take 145 mcg by mouth daily.  05/01/18  Yes [provider]  lisinopril-hydrochlorothiazide (ZESTORETIC) 20-12.5 MG tablet Take 1 tablet by mouth daily. 10/01/17  Yes [provider]  QUEtiapine (SEROQUEL) 300 MG tablet Take 600 mg by mouth at bedtime.    Yes [provider]  simvastatin (ZOCOR) 20 MG tablet Take 20 mg by mouth daily.  10/01/17  Yes [provider]  Vitamin D, Ergocalciferol, (DRISDOL) 1.25 MG (50000 UT) CAPS capsule Take 50,000 Units by mouth once a week. Saturdays 03/13/18  Yes [provider]    Allergies as of 03/29/2019  . (No Known  Allergies)    Family History  Problem Relation Age of Onset  . Breast cancer Mother   . Liver disease Neg Hx   . Colon cancer Neg Hx     Social History   Socioeconomic History  . Marital status: Legally Separated    Spouse name: Not on file  . Number of children: Not on file  . Years of education: Not on file  . Highest education level: Not on file  Occupational History  . Not on file  Social Needs  . Financial resource strain: Not on file  . Food insecurity    Worry: Not on file    Inability: Not on file  . Transportation needs    Medical: Not on file    Non-medical: Not on file  Tobacco Use  . Smoking status: Current Every Day Smoker    Packs/day: 1.00    Years: 28.00    Pack years: 28.00    Types: Cigarettes  . Smokeless tobacco: Never Used  Substance and Sexual Activity  . Alcohol use: Yes    Comment: 3 times/weekly. 7-8 12 ounces beers  . Drug use: Not Currently  . Sexual activity: Not on file  Lifestyle  . Physical activity    Days per week: Not on file    Minutes per session: Not on file  . Stress: Not on file  Relationships  . Social connections  Talks on phone: Not on file    Gets together: Not on file    Attends religious service: Not on file    Active member of club or organization: Not on file    Attends meetings of clubs or organizations: Not on file    Relationship status: Not on file  . Intimate partner violence    Fear of current or ex partner: Not on file    Emotionally abused: Not on file    Physically abused: Not on file    Forced sexual activity: Not on file  Other Topics Concern  . Not on file  Social History Narrative  . Not on file    Review of Systems: See HPI, otherwise negative ROS  Physical Exam: LMP 04/22/2019 (Approximate)  General:   Alert,  Well-developed, well-nourished, pleasant and cooperative in NAD Neck:  Supple; no masses or thyromegaly. No significant cervical adenopathy. Lungs:  Clear throughout to  auscultation.   No wheezes, crackles, or rhonchi. No acute distress. Heart:  Regular rate and rhythm; no murmurs, clicks, rubs,  or gallops. Abdomen: Non-distended, normal bowel sounds.  Soft and nontender without appreciable mass or hepatosplenomegaly.  Pulses:  Normal pulses noted. Extremities:  Without clubbing or edema.  Impression/Plan: Intermittent rectal bleeding in the setting of constipation.  Colonoscopy to further evaluate.  The risks, benefits, limitations, alternatives and imponderables have been reviewed with the patient. Questions have been answered. All parties are agreeable.      Notice: This dictation was prepared with Dragon dictation along with smaller phrase technology. Any transcriptional errors that result from this process are unintentional and may not be corrected upon review.

## 2019-05-10 NOTE — Anesthesia Postprocedure Evaluation (Signed)
Anesthesia Post Note  Patient: Evelyn Simpson  Procedure(s) Performed: COLONOSCOPY WITH PROPOFOL (N/A )  Patient location during evaluation: PACU Anesthesia Type: MAC Level of consciousness: awake and alert and oriented Pain management: pain level controlled Vital Signs Assessment: post-procedure vital signs reviewed and stable Respiratory status: spontaneous breathing Cardiovascular status: blood pressure returned to baseline and stable Postop Assessment: no apparent nausea or vomiting and adequate PO intake Anesthetic complications: no     Last Vitals:  Vitals:   05/10/19 1316 05/10/19 1417  BP: 126/80   Pulse: 91 90  Resp: 18 (!) 22  Temp: 37 C   SpO2: 100% 100%    Last Pain:  Vitals:   05/10/19 1350  TempSrc:   PainSc: 0-No pain                 Hermelinda Diegel

## 2019-05-10 NOTE — Anesthesia Preprocedure Evaluation (Signed)
Anesthesia Evaluation    Airway Mallampati: II       Dental  (+) Teeth Intact   Pulmonary Current Smoker,    breath sounds clear to auscultation       Cardiovascular hypertension,  Rhythm:regular     Neuro/Psych PSYCHIATRIC DISORDERS Anxiety Depression Bipolar Disorder    GI/Hepatic   Endo/Other  Hypothyroidism Morbid obesity  Renal/GU      Musculoskeletal   Abdominal   Peds  Hematology   Anesthesia Other Findings   Reproductive/Obstetrics                             Anesthesia Physical Anesthesia Plan  ASA: III  Anesthesia Plan: MAC   Post-op Pain Management:    Induction:   PONV Risk Score and Plan:   Airway Management Planned:   Additional Equipment:   Intra-op Plan:   Post-operative Plan:   Informed Consent: I have reviewed the patients History and Physical, chart, labs and discussed the procedure including the risks, benefits and alternatives for the proposed anesthesia with the patient or authorized representative who has indicated his/her understanding and acceptance.       Plan Discussed with: Anesthesiologist  Anesthesia Plan Comments:         Anesthesia Quick Evaluation

## 2019-05-14 ENCOUNTER — Encounter (HOSPITAL_COMMUNITY): Payer: Self-pay | Admitting: Internal Medicine

## 2019-08-10 ENCOUNTER — Ambulatory Visit: Payer: Medicaid Other | Admitting: Gastroenterology

## 2019-08-10 ENCOUNTER — Encounter: Payer: Self-pay | Admitting: Internal Medicine

## 2019-08-10 ENCOUNTER — Telehealth: Payer: Self-pay | Admitting: Internal Medicine

## 2019-08-10 NOTE — Telephone Encounter (Signed)
PATIENT WAS A NO SHOW AND LETTER SENT  °

## 2019-10-07 ENCOUNTER — Other Ambulatory Visit: Payer: Self-pay | Admitting: Internal Medicine

## 2020-01-17 ENCOUNTER — Encounter: Payer: Self-pay | Admitting: Internal Medicine

## 2020-10-29 ENCOUNTER — Encounter (HOSPITAL_COMMUNITY): Payer: Self-pay

## 2020-10-29 ENCOUNTER — Other Ambulatory Visit: Payer: Self-pay

## 2020-10-29 ENCOUNTER — Emergency Department (HOSPITAL_COMMUNITY): Payer: Medicaid Other

## 2020-10-29 ENCOUNTER — Inpatient Hospital Stay (HOSPITAL_COMMUNITY)
Admission: EM | Admit: 2020-10-29 | Discharge: 2020-10-31 | DRG: 339 | Disposition: A | Discharge status: Home / Self Care | Payer: Medicaid Other | Attending: General Surgery | Admitting: General Surgery

## 2020-10-29 DIAGNOSIS — I10 Essential (primary) hypertension: Secondary | ICD-10-CM | POA: Diagnosis present

## 2020-10-29 DIAGNOSIS — K3532 Acute appendicitis with perforation and localized peritonitis, without abscess: Principal | ICD-10-CM | POA: Diagnosis present

## 2020-10-29 DIAGNOSIS — E7800 Pure hypercholesterolemia, unspecified: Secondary | ICD-10-CM | POA: Diagnosis present

## 2020-10-29 DIAGNOSIS — K529 Noninfective gastroenteritis and colitis, unspecified: Secondary | ICD-10-CM

## 2020-10-29 DIAGNOSIS — F319 Bipolar disorder, unspecified: Secondary | ICD-10-CM | POA: Diagnosis present

## 2020-10-29 DIAGNOSIS — K3580 Unspecified acute appendicitis: Secondary | ICD-10-CM | POA: Diagnosis present

## 2020-10-29 DIAGNOSIS — K810 Acute cholecystitis: Secondary | ICD-10-CM | POA: Diagnosis present

## 2020-10-29 DIAGNOSIS — F17210 Nicotine dependence, cigarettes, uncomplicated: Secondary | ICD-10-CM | POA: Diagnosis present

## 2020-10-29 DIAGNOSIS — E039 Hypothyroidism, unspecified: Secondary | ICD-10-CM | POA: Diagnosis present

## 2020-10-29 DIAGNOSIS — Z79899 Other long term (current) drug therapy: Secondary | ICD-10-CM

## 2020-10-29 DIAGNOSIS — Z79890 Hormone replacement therapy (postmenopausal): Secondary | ICD-10-CM

## 2020-10-29 DIAGNOSIS — K352 Acute appendicitis with generalized peritonitis, without abscess: Secondary | ICD-10-CM

## 2020-10-29 DIAGNOSIS — Z20822 Contact with and (suspected) exposure to covid-19: Secondary | ICD-10-CM | POA: Diagnosis present

## 2020-10-29 DIAGNOSIS — E876 Hypokalemia: Secondary | ICD-10-CM | POA: Diagnosis present

## 2020-10-29 DIAGNOSIS — E871 Hypo-osmolality and hyponatremia: Secondary | ICD-10-CM | POA: Diagnosis present

## 2020-10-29 LAB — BLOOD GAS, VENOUS
Acid-Base Excess: 2.1 mmol/L — ABNORMAL HIGH (ref 0.0–2.0)
Bicarbonate: 26.4 mmol/L (ref 20.0–28.0)
FIO2: 21
O2 Saturation: 76.8 %
Patient temperature: 37.6
pCO2, Ven: 34.2 mmHg — ABNORMAL LOW (ref 44.0–60.0)
pH, Ven: 7.484 — ABNORMAL HIGH (ref 7.250–7.430)
pO2, Ven: 41 mmHg (ref 32.0–45.0)

## 2020-10-29 LAB — COMPREHENSIVE METABOLIC PANEL
ALT: 32 U/L (ref 0–44)
AST: 26 U/L (ref 15–41)
Albumin: 4 g/dL (ref 3.5–5.0)
Alkaline Phosphatase: 76 U/L (ref 38–126)
Anion gap: 14 (ref 5–15)
BUN: 13 mg/dL (ref 6–20)
CO2: 26 mmol/L (ref 22–32)
Calcium: 8.6 mg/dL — ABNORMAL LOW (ref 8.9–10.3)
Chloride: 89 mmol/L — ABNORMAL LOW (ref 98–111)
Creatinine, Ser: 1.1 mg/dL — ABNORMAL HIGH (ref 0.44–1.00)
GFR, Estimated: >60 mL/min (ref >60)
Glucose, Bld: 150 mg/dL — ABNORMAL HIGH (ref 70–99)
Potassium: 3.2 mmol/L — ABNORMAL LOW (ref 3.5–5.1)
Sodium: 129 mmol/L — ABNORMAL LOW (ref 135–145)
Total Bilirubin: 1.2 mg/dL (ref 0.3–1.2)
Total Protein: 8.1 g/dL (ref 6.5–8.1)

## 2020-10-29 LAB — CBC WITH DIFFERENTIAL/PLATELET
Band Neutrophils: 18 %
Basophils Absolute: 0 10*3/uL (ref 0.0–0.1)
Basophils Relative: 0 %
Eosinophils Absolute: 0 10*3/uL (ref 0.0–0.5)
Eosinophils Relative: 0 %
HCT: 44.8 % (ref 36.0–46.0)
Hemoglobin: 15.4 g/dL — ABNORMAL HIGH (ref 12.0–15.0)
Lymphocytes Relative: 9 %
Lymphs Abs: 1.4 10*3/uL (ref 0.7–4.0)
MCH: 31.8 pg (ref 26.0–34.0)
MCHC: 34.4 g/dL (ref 30.0–36.0)
MCV: 92.6 fL (ref 80.0–100.0)
Metamyelocytes Relative: 5 %
Monocytes Absolute: 2 10*3/uL — ABNORMAL HIGH (ref 0.1–1.0)
Monocytes Relative: 13 %
Myelocytes: 4 %
Neutro Abs: 10.5 10*3/uL — ABNORMAL HIGH (ref 1.7–7.7)
Neutrophils Relative %: 50 %
Platelets: 308 10*3/uL (ref 150–400)
Promyelocytes Relative: 1 %
RBC: 4.84 MIL/uL (ref 3.87–5.11)
RDW: 12.9 % (ref 11.5–15.5)
WBC: 15.5 10*3/uL — ABNORMAL HIGH (ref 4.0–10.5)
nRBC: 0 % (ref 0.0–0.2)

## 2020-10-29 LAB — HIV ANTIBODY (ROUTINE TESTING W REFLEX): HIV Screen 4th Generation wRfx: NONREACTIVE

## 2020-10-29 LAB — AMMONIA: Ammonia: 25 umol/L (ref 9–35)

## 2020-10-29 LAB — RESP PANEL BY RT-PCR (FLU A&B, COVID) ARPGX2
Influenza A by PCR: NEGATIVE
Influenza B by PCR: NEGATIVE
SARS Coronavirus 2 by RT PCR: NEGATIVE

## 2020-10-29 LAB — ETHANOL: Alcohol, Ethyl (B): <10 mg/dL (ref <10)

## 2020-10-29 LAB — HCG, SERUM, QUALITATIVE: Preg, Serum: NEGATIVE

## 2020-10-29 MED ORDER — ONDANSETRON HCL 4 MG/2ML IJ SOLN
4.0000 mg | Freq: Once | INTRAMUSCULAR | Status: AC
  Administered 2020-10-29: 4 mg via INTRAVENOUS
  Filled 2020-10-29: qty 2

## 2020-10-29 MED ORDER — IOHEXOL 300 MG/ML  SOLN
100.0000 mL | Freq: Once | INTRAMUSCULAR | Status: AC | PRN
  Administered 2020-10-29: 100 mL via INTRAVENOUS

## 2020-10-29 MED ORDER — ENOXAPARIN SODIUM 40 MG/0.4ML ~~LOC~~ SOLN
40.0000 mg | SUBCUTANEOUS | Status: DC
  Administered 2020-10-29: 17:00:00 40 mg via SUBCUTANEOUS
  Filled 2020-10-29 (×2): qty 0.4

## 2020-10-29 MED ORDER — POTASSIUM CHLORIDE IN NACL 40-0.9 MEQ/L-% IV SOLN
INTRAVENOUS | Status: DC
  Filled 2020-10-29 (×6): qty 1000

## 2020-10-29 MED ORDER — LISINOPRIL 10 MG PO TABS
20.0000 mg | ORAL_TABLET | Freq: Every day | ORAL | Status: DC
  Administered 2020-10-29 – 2020-10-31 (×3): 20 mg via ORAL
  Filled 2020-10-29 (×3): qty 2

## 2020-10-29 MED ORDER — FENTANYL CITRATE (PF) 100 MCG/2ML IJ SOLN
100.0000 ug | INTRAMUSCULAR | Status: DC | PRN
  Administered 2020-10-29 (×2): 100 ug via INTRAVENOUS
  Filled 2020-10-29 (×2): qty 2

## 2020-10-29 MED ORDER — ONDANSETRON HCL 4 MG/2ML IJ SOLN
4.0000 mg | Freq: Once | INTRAMUSCULAR | Status: AC
  Administered 2020-10-29: 16:00:00 4 mg via INTRAVENOUS
  Filled 2020-10-29: qty 2

## 2020-10-29 MED ORDER — ACETAMINOPHEN 650 MG RE SUPP: 650.0000 mg | Freq: Four times a day (QID) | RECTAL | Status: DC | PRN

## 2020-10-29 MED ORDER — POTASSIUM CHLORIDE 10 MEQ/100ML IV SOLN
10.0000 meq | INTRAVENOUS | Status: AC
Start: 2020-10-29 — End: 2020-10-29
  Administered 2020-10-29 (×3): 10 meq via INTRAVENOUS
  Filled 2020-10-29 (×3): qty 100

## 2020-10-29 MED ORDER — PIPERACILLIN-TAZOBACTAM 3.375 G IVPB 30 MIN
3.3750 g | Freq: Once | INTRAVENOUS | Status: AC
  Administered 2020-10-29: 15:00:00 3.375 g via INTRAVENOUS
  Filled 2020-10-29: qty 50

## 2020-10-29 MED ORDER — ONDANSETRON HCL 4 MG/2ML IJ SOLN: 4.0000 mg | Freq: Four times a day (QID) | INTRAMUSCULAR | Status: DC | PRN

## 2020-10-29 MED ORDER — SODIUM CHLORIDE 0.9 % IV BOLUS
1000.0000 mL | Freq: Once | INTRAVENOUS | Status: AC
  Administered 2020-10-29: 1000 mL via INTRAVENOUS

## 2020-10-29 MED ORDER — SODIUM CHLORIDE 0.9 % IV BOLUS
1000.0000 mL | Freq: Once | INTRAVENOUS | Status: AC
  Administered 2020-10-29: 16:00:00 1000 mL via INTRAVENOUS

## 2020-10-29 MED ORDER — LORAZEPAM 2 MG/ML IJ SOLN
1.0000 mg | INTRAMUSCULAR | Status: DC | PRN
  Administered 2020-10-29 – 2020-10-30 (×3): 1 mg via INTRAVENOUS
  Filled 2020-10-29 (×3): qty 1

## 2020-10-29 MED ORDER — NICOTINE 21 MG/24HR TD PT24
21.0000 mg | MEDICATED_PATCH | Freq: Every day | TRANSDERMAL | Status: DC
  Administered 2020-10-29 – 2020-10-31 (×3): 21 mg via TRANSDERMAL
  Filled 2020-10-29 (×3): qty 1

## 2020-10-29 MED ORDER — ACETAMINOPHEN 325 MG PO TABS
650.0000 mg | ORAL_TABLET | Freq: Four times a day (QID) | ORAL | Status: DC | PRN
  Administered 2020-10-30 – 2020-10-31 (×2): 650 mg via ORAL
  Filled 2020-10-29 (×2): qty 2

## 2020-10-29 MED ORDER — LEVOTHYROXINE SODIUM 50 MCG PO TABS
100.0000 ug | ORAL_TABLET | Freq: Every day | ORAL | Status: DC
  Administered 2020-10-30: 06:00:00 100 ug via ORAL
  Filled 2020-10-29: qty 2

## 2020-10-29 MED ORDER — FENTANYL CITRATE (PF) 100 MCG/2ML IJ SOLN
50.0000 ug | INTRAMUSCULAR | Status: DC | PRN
  Administered 2020-10-29 – 2020-10-30 (×4): 50 ug via INTRAVENOUS
  Filled 2020-10-29 (×4): qty 2

## 2020-10-29 MED ORDER — PIPERACILLIN-TAZOBACTAM 3.375 G IVPB
3.3750 g | Freq: Three times a day (TID) | INTRAVENOUS | Status: DC
  Administered 2020-10-29 – 2020-10-31 (×4): 3.375 g via INTRAVENOUS
  Filled 2020-10-29 (×5): qty 50

## 2020-10-29 MED ORDER — ONDANSETRON 4 MG PO TBDP: 4.0000 mg | ORAL_TABLET | Freq: Four times a day (QID) | ORAL | Status: DC | PRN

## 2020-10-29 NOTE — ED Provider Notes (Signed)
Evelyn Simpson   CSN: [id number] Arrival date & time: 10/29/20  1139     History Chief Complaint  Patient presents with  . Abdominal Pain    Evelyn Simpson is a 44 y.o. female.  HPI She presents by private vehicle for evaluation of dizziness and abdominal pain.  She states this problem started yesterday.  She was seen at an urgent care yesterday and told to take magnesium citrate for constipation.  She states she had a bowel movement after taking magnesium citrate.  She was also given Zofran for nausea which she is apparently taking.  She did not eat today because of decreased appetite.  She denies fever, chills, cough, shortness of breath, weakness or dizziness.  She states she is taking her other medicines as prescribed.  There are no other known modifying factors.    Past Medical History:  Diagnosis Date  . ADD (attention deficit disorder)   . Anxiety   . Bipolar 1 disorder (HCC)   . Depression   . HTN (hypertension)   . Hypercholesteremia   . Hypothyroidism     Patient Active Problem List   Diagnosis Date Noted  . Abnormal LFTs 03/22/2019  . Constipation 03/22/2019  . Rectal bleeding 03/22/2019    Past Surgical History:  Procedure Laterality Date  . CERVIX SURGERY    . COLONOSCOPY WITH PROPOFOL N/A 05/10/2019   Procedure: COLONOSCOPY WITH PROPOFOL;  Surgeon: Corbin Ade, MD;  Location: [facility];  Service: Endoscopy;  Laterality: N/A;  2:30pm - pt does not want to move up  . HAND EXPLORATION Left    removal of glass from finger  . MANDIBLE SURGERY       OB History   No obstetric history on file.     Family History  Problem Relation Age of Onset  . Breast cancer Mother   . Liver disease Neg Hx   . Colon cancer Neg Hx     Social History   Tobacco Use  . Smoking status: Current Every Day Smoker    Packs/day: 1.00    Years: 28.00    Pack years: 28.00    Types: Cigarettes  . Smokeless tobacco: Never Used   Vaping Use  . Vaping Use: Never used  Substance Use Topics  . Alcohol use: Yes  . Drug use: Not Currently    Home Medications Prior to Admission medications   Medication Sig Start Date End Date Taking? Authorizing Provider  ALPRAZolam (XANAX XR) 1 MG 24 hr tablet Take 1 mg by mouth every morning.     [provider]  ALPRAZolam Prudy Feeler) 1 MG tablet Take 1 tablet by mouth every evening.     [provider]  desvenlafaxine (PRISTIQ) 100 MG 24 hr tablet Take 100 mg by mouth 2 (two) times daily.     [provider]  levothyroxine (SYNTHROID) 112 MCG tablet Take 112 mcg by mouth daily before breakfast.  01/19/19   [provider]  Karlene Einstein 290 MCG CAPS capsule TAKE ONE (1) CAPSULE EACH DAY 10/08/19   Gelene Mink, NP  lisinopril-hydrochlorothiazide (ZESTORETIC) 20-12.5 MG tablet Take 1 tablet by mouth daily. 10/01/17   [provider]  QUEtiapine (SEROQUEL) 300 MG tablet Take 600 mg by mouth at bedtime.     [provider]  simvastatin (ZOCOR) 20 MG tablet Take 20 mg by mouth daily.  10/01/17   [provider]  Vitamin D, Ergocalciferol, (DRISDOL) 1.25 MG (50000 UT) CAPS capsule  Take 50,000 Units by mouth once a week. Saturdays 03/13/18   [provider]    Allergies    Patient has no known allergies.  Review of Systems   Review of Systems  All other systems reviewed and are negative.   Physical Exam Updated Vital Signs BP 120/72 (BP Location: Right Arm)   Pulse (!) 114   Temp (!) 100.6 F (38.1 C) (Oral)   Resp (!) 26   Ht 5\' 4"  (1.626 m)   Wt 97.5 kg   SpO2 95%   BMI 36.90 kg/m   Physical Exam Vitals and nursing Simpson reviewed.  Constitutional:      General: She is in acute distress (Uncomfortable, restless).     Appearance: She is well-developed and well-nourished. She is obese. She is not ill-appearing, toxic-appearing or diaphoretic.  HENT:     Head: Normocephalic and atraumatic.  Eyes:      Extraocular Movements: EOM normal.     Conjunctiva/sclera: Conjunctivae normal.     Pupils: Pupils are equal, round, and reactive to light.  Neck:     Trachea: Phonation normal.  Cardiovascular:     Rate and Rhythm: Normal rate and regular rhythm.  Pulmonary:     Effort: Pulmonary effort is normal.     Breath sounds: Normal breath sounds.  Chest:     Chest wall: No tenderness.  Abdominal:     General: There is no distension.     Palpations: Abdomen is soft. There is no mass.     Tenderness: There is no abdominal tenderness. There is no guarding.  Musculoskeletal:        General: Normal range of motion.     Cervical back: Normal range of motion and neck supple.  Skin:    General: Skin is warm and dry.  Neurological:     Mental Status: She is alert and oriented to person, place, and time.     Motor: No abnormal muscle tone.  Psychiatric:        Attention and Perception: She is inattentive.        Mood and Affect: Mood is anxious.        Speech: Speech is delayed.        Behavior: Behavior is slowed. Behavior is not agitated, aggressive or hyperactive.        Thought Content: Thought content is not paranoid or delusional.        Cognition and Memory: Cognition is impaired.        Judgment: Judgment is impulsive.     ED Results / Procedures / Treatments   Labs (all labs ordered are listed, but only abnormal results are displayed) Labs Reviewed  COMPREHENSIVE METABOLIC PANEL - Abnormal; Notable for the following components:      Result Value   Sodium 129 (*)    Potassium 3.2 (*)    Chloride 89 (*)    Glucose, Bld 150 (*)    Creatinine, Ser 1.10 (*)    Calcium 8.6 (*)    All other components within normal limits  CBC WITH DIFFERENTIAL/PLATELET - Abnormal; Notable for the following components:   WBC 15.5 (*)    Hemoglobin 15.4 (*)    Neutro Abs 10.5 (*)    Monocytes Absolute 2.0 (*)    All other components within normal limits  BLOOD GAS, VENOUS - Abnormal; Notable for  the following components:   pH, Ven 7.484 (*)    pCO2, Ven 34.2 (*)    Acid-Base Excess 2.1 (*)  All other components within normal limits  RESP PANEL BY RT-PCR (FLU A&B, COVID) ARPGX2  ETHANOL  AMMONIA  HCG, SERUM, QUALITATIVE  URINALYSIS, ROUTINE W REFLEX MICROSCOPIC  RAPID URINE DRUG SCREEN, HOSP PERFORMED    EKG None  Radiology CT Abdomen Pelvis W Contrast  Result Date: 10/29/2020 CLINICAL DATA:  Acute abdominal pain with nausea EXAM: CT ABDOMEN AND PELVIS WITH CONTRAST TECHNIQUE: Multidetector CT imaging of the abdomen and pelvis was performed using the standard protocol following bolus administration of intravenous contrast. CONTRAST:  OMNIPAQUE IOHEXOL 300 MG/ML  SOLN COMPARISON:  None. FINDINGS: Lower chest: Bibasilar and lingula atelectasis. No pleural or pericardial effusion. Normal heart size. Hepatobiliary: No focal hepatic abnormality or biliary dilatation. Hepatic and portal veins are patent. Gallbladder and biliary tree unremarkable. Common bile duct nondilated. Trace perihepatic fluid along the right inferior liver. Pancreas: Unremarkable. No pancreatic ductal dilatation or surrounding inflammatory changes. Spleen: No focal abnormality. Normal size. Trace left upper quadrant subdiaphragmatic and perisplenic free fluid. Adrenals/Urinary Tract: Normal adrenal glands. Small bilateral subcentimeter renal cysts. No renal obstruction or hydronephrosis. No hydroureter or ureteral calculus. Bladder unremarkable. Stomach/Bowel: Negative for bowel obstruction, significant dilatation, or free air. In the right lower quadrant, the appendix is dilated with fluid distension, mucosal enhancement and small radiodense appendicoliths compatible with appendicitis. Additionally, several loops of mid and distal small bowel demonstrate slight wall thickening and mucosal enhancement. Similar wall thickening and enhancement of the cecum. Central mesentery demonstrates diffuse edema/inflammation  with a few scattered small areas of mesenteric interloop free fluid. This remains nonspecific but suggest a secondary associated enterocolitis/peritonitis pattern. Vascular/Lymphatic: Intact aorta. Infrarenal atherosclerotic change. No aneurysm or occlusive process. No retroperitoneal hemorrhage or hematoma. Mesenteric and renal vasculature appear to remain patent. No veno-occlusive process. No bulky adenopathy. Reproductive: IUD within the midline of the uterus endometrial cavity. Uterus and adnexa normal in size. Trace pelvic free fluid as well. Other: Intact abdominal wall. Musculoskeletal: Degenerative changes of the spine. Lower lumbar facet arthropathy. No acute osseous finding. IMPRESSION: Acute appendicitis with associated appendicoliths. Diffuse mid and distal small bowel and cecal wall thickening with mucosal enhancement as well as central mesentery edema and scattered areas of mesenteric interloop free fluid. Appearance is compatible with associated enterocolitis/peritonitis. No discernible abscess, free air or evidence of perforation. These results were called by telephone at the time of interpretation on 10/29/2020 at 2:27 pm to provider [facility] , who verbally acknowledged these results. Electronically Signed   By: Judie Petit.  Shick M.D.   On: 10/29/2020 14:29    Procedures .Critical Care Performed by: Mancel Bale, MD Authorized by: Mancel Bale, MD   Critical care provider statement:    Critical care time (minutes):  40   Critical care start time:  10/29/2020 12:15 PM   Critical care end time:  10/29/2020 3:03 PM   Critical care time was exclusive of:  Separately billable procedures and treating other patients   Critical care was necessary to treat or prevent imminent or life-threatening deterioration of the following conditions: Enterocolitis, appendicitis.   Critical care was time spent personally by me on the following activities:  Blood draw for specimens, development of treatment  plan with patient or surrogate, discussions with consultants, evaluation of patient's response to treatment, examination of patient, obtaining history from patient or surrogate, ordering and performing treatments and interventions, ordering and review of laboratory studies, pulse oximetry, re-evaluation of patient's condition, review of old charts and ordering and review of radiographic studies   (including critical care  time)  Medications Ordered in ED Medications  piperacillin-tazobactam (ZOSYN) IVPB 3.375 g (has no administration in time range)  sodium chloride 0.9 % bolus 1,000 mL (0 mLs Intravenous Stopped 10/29/20 1436)  ondansetron (ZOFRAN) injection 4 mg (4 mg Intravenous Given 10/29/20 1237)  iohexol (OMNIPAQUE) 300 MG/ML solution 100 mL (100 mLs Intravenous Contrast Given 10/29/20 1337)    ED Course  I have reviewed the triage vital signs and the nursing notes.  Pertinent labs & imaging results that were available during my care of the patient were reviewed by me and considered in my medical decision making (see chart for details).  Clinical Course as of 10/29/20 1503  Sun Oct 29, 2020  1435 CT results discussed with the radiologist who interpreted them, we discussed the implications and the necessary treatments. [EW]  1456 Case discussed with general surgery, Dr. Lovell SheehanJenkins will evaluate the patient in the ED.  He wants antibiotics started, Zosyn ordered. [EW]    Clinical Course User Index [EW] Mancel BaleWentz, Talin Rozeboom, MD   MDM Rules/Calculators/A&P                           Patient Vitals for the past 24 hrs:  BP Temp Temp src Pulse Resp SpO2 Height Weight  10/29/20 1436 120/72 (!) 100.6 F (38.1 C) Oral (!) 114 (!) 26 95 % -- --  10/29/20 1315 -- -- -- -- (!) 33 -- -- --  10/29/20 1300 (!) 143/80 -- -- (!) 110 -- 99 % -- --  10/29/20 1230 129/74 -- -- (!) 129 -- 97 % -- --  10/29/20 1215 129/77 -- -- (!) 121 -- 97 % -- --  10/29/20 1209 -- -- -- -- -- -- 5\' 4"  (1.626 m) 97.5 kg   10/29/20 1207 114/78 99.1 F (37.3 C) Oral (!) 121 18 96 % -- --    3:00 PM Reevaluation with update and discussion. After initial assessment and treatment, an updated evaluation reveals she is more comfortable at this time.  Findings discussed with patient and mother who is in the room, all questions answered. Mancel BaleElliott Mylia Pondexter   Medical Decision Making:  This patient is presenting for evaluation of abdominal pain and dizziness, which does require a range of treatment options, and is a complaint that involves a high risk of morbidity and mortality. The differential diagnoses include gastritis, diverticulitis, appendicitis, Covid infection. I decided to review old records, and in summary obese female presenting with decreased stooling, with abdominal pain and dizziness.  Nonspecific findings.  She also has bipolar disorder..  I obtained additional historical information from mother at bedside.  Clinical Laboratory Tests Ordered, included pregnancy test, CBC, Metabolic panel, Urinalysis and Venous blood gas, Covid test, influenza test. Review indicates normal venous blood gas, sodium low, potassium low, chloride low, glucose high, creatinine high, white count high, hemoglobin high. Radiologic Tests Ordered, included CT abdomen pelvis.  I independently Visualized: CT images, which show appendicitis, inflammatory process in small and large intestine as well and likely peritonitis    Critical Interventions-clinical evaluation, laboratory testing, CT imaging, IV fluids, IV antibiotics, observation reassessment  After These Interventions, the Patient was reevaluated and was found with nonspecific intestinal findings and likely acute appendicitis.  Is unclear if these are conjoined process.  General surgery consulted for possible appendectomy.  Antibiotics started in the ED.  She is hemodynamically stable.  Doubt severe sepsis  Janyth Pupaorie Finchum was evaluated in Emergency Department on 10/29/2020 for the  symptoms  described in the history of present illness. She was evaluated in the context of the global COVID-19 pandemic, which necessitated consideration that the patient might be at risk for infection with the SARS-CoV-2 virus that causes COVID-19. Institutional protocols and algorithms that pertain to the evaluation of patients at risk for COVID-19 are in a state of rapid change based on information released by regulatory bodies including the CDC and federal and state organizations. These policies and algorithms were followed during the patient's care in the ED.   CRITICAL CARE-yes Performed by: Mancel Bale  Nursing Notes Reviewed/ Care Coordinated Applicable Imaging Reviewed Interpretation of Laboratory Data incorporated into ED treatment   Plan-disposition in conjunction with general surgery.    Final Clinical Impression(s) / ED Diagnoses Final diagnoses:  Acute appendicitis with generalized peritonitis, without gangrene or abscess, unspecified whether perforation present  Enterocolitis    Rx / DC Orders ED Discharge Orders    None       Mancel Bale, MD 10/29/20 1503

## 2020-10-29 NOTE — ED Triage Notes (Signed)
Pt to er room number 12, pt states that she is here for abd pain and constipation, states that she went to the urgent care in [facility] yesterday and they gave her some mag citrate that she vomited and then some medicine for nausea that she also vomited, states that she had a bm last night, states that it was runny with no formed stool, states that she continues to have the abd cramping, states that she has some nausea at this time.

## 2020-10-29 NOTE — Consult Note (Signed)
Reason for Consult: Acute appendicitis Referring Physician: Dr. Effie Shy, ER  Evelyn Simpson is an 44 y.o. female.  HPI: Patient is a 44 year old white female who presented to the emergency room with a 48-hour history of worsening right lower quadrant abdominal pain.  She has a longstanding history of intermittent constipation.  She was seen by an urgent care center yesterday but was told that she was constipated and was given milk of magnesia to take at home.  As her pain worsened overnight, she presented to the emergency room and CT scan of the abdomen reveals acute appendicitis with appendicoliths and a significant amount of surrounding enterocolitis.  There is no abscess cavity present.  There is no evidence of perforation.  She has had nausea and vomiting.  Her appetite is decreased.  She denies any fever or chills.  Past Medical History:  Diagnosis Date  . ADD (attention deficit disorder)   . Anxiety   . Bipolar 1 disorder (HCC)   . Depression   . HTN (hypertension)   . Hypercholesteremia   . Hypothyroidism     Past Surgical History:  Procedure Laterality Date  . CERVIX SURGERY    . COLONOSCOPY WITH PROPOFOL N/A 05/10/2019   Procedure: COLONOSCOPY WITH PROPOFOL;  Surgeon: Corbin Ade, MD;  Location: [facility];  Service: Endoscopy;  Laterality: N/A;  2:30pm - pt does not want to move up  . HAND EXPLORATION Left    removal of glass from finger  . MANDIBLE SURGERY      Family History  Problem Relation Age of Onset  . Breast cancer Mother   . Liver disease Neg Hx   . Colon cancer Neg Hx     Social History:  reports that she has been smoking cigarettes. She has a 28.00 pack-year smoking history. She has never used smokeless tobacco. She reports current alcohol use. She reports previous drug use.  Allergies: No Known Allergies  Medications: I have reviewed the patient's current medications.  Results for orders placed or performed during the hospital encounter of 10/29/20  (from the past 48 hour(s))  Comprehensive metabolic panel     Status: Abnormal   Collection Time: 10/29/20 12:27 PM  Result Value Ref Range   Sodium 129 (L) 135 - 145 mmol/L   Potassium 3.2 (L) 3.5 - 5.1 mmol/L   Chloride 89 (L) 98 - 111 mmol/L   CO2 26 22 - 32 mmol/L   Glucose, Bld 150 (H) 70 - 99 mg/dL    Comment: Glucose reference range applies only to samples taken after fasting for at least 8 hours.   BUN 13 6 - 20 mg/dL   Creatinine, Ser 0.62 (H) 0.44 - 1.00 mg/dL   Calcium 8.6 (L) 8.9 - 10.3 mg/dL   Total Protein 8.1 6.5 - 8.1 g/dL   Albumin 4.0 3.5 - 5.0 g/dL   AST 26 15 - 41 U/L   ALT 32 0 - 44 U/L   Alkaline Phosphatase 76 38 - 126 U/L   Total Bilirubin 1.2 0.3 - 1.2 mg/dL   GFR, Estimated >37 >62 mL/min    Comment: (NOTE) Calculated using the CKD-EPI Creatinine Equation (2021)    Anion gap 14 5 - 15    Comment: Performed at [facility], [address], [place], [place] [address]  CBC with Differential     Status: Abnormal   Collection Time: 10/29/20 12:27 PM  Result Value Ref Range   WBC 15.5 (H) 4.0 - 10.5 K/uL  Comment: REPEATED TO VERIFY WHITE COUNT CONFIRMED ON SMEAR    RBC 4.84 3.87 - 5.11 MIL/uL   Hemoglobin 15.4 (H) 12.0 - 15.0 g/dL   HCT 44.8 36.0 - 46.0 %   MCV 92.6 80.0 - 100.0 fL   MCH 31.8 26.0 - 34.0 pg   MCHC 34.4 30.0 - 36.0 g/dL   RDW 12.9 11.5 - 15.5 %   Platelets 308 150 - 400 K/uL   nRBC 0.0 0.0 - 0.2 %    Comment: REPEATED TO VERIFY   Neutrophils Relative % 50 %   Neutro Abs 10.5 (H) 1.7 - 7.7 K/uL   Band Neutrophils 18 %   Lymphocytes Relative 9 %   Lymphs Abs 1.4 0.7 - 4.0 K/uL   Monocytes Relative 13 %   Monocytes Absolute 2.0 (H) 0.1 - 1.0 K/uL   Eosinophils Relative 0 %   Eosinophils Absolute 0.0 0.0 - 0.5 K/uL   Basophils Relative 0 %   Basophils Absolute 0.0 0.0 - 0.1 K/uL   WBC Morphology MILD LEFT SHIFT (1-5% METAS, OCC MYELO, OCC BANDS)     Comment: TOXIC GRANULATION VACUOLATED NEUTROPHILS    Metamyelocytes Relative 5  %   Myelocytes 4 %   Promyelocytes Relative 1 %    Comment: Performed at [facility], [address] [place], [place] [address]  Ethanol     Status: None   Collection Time: 10/29/20 12:27 PM  Result Value Ref Range   Alcohol, Ethyl (B) <10 <10 mg/dL    Comment: (NOTE) Lowest detectable limit for serum alcohol is 10 mg/dL.  For medical purposes only. Performed at [facility], [address] [place], [place] [address]   Resp Panel by RT-PCR (Flu A&B, Covid) Nasopharyngeal Swab     Status: None   Collection Time: 10/29/20 12:31 PM   Specimen: Nasopharyngeal Swab; Nasopharyngeal(NP) swabs in vial transport medium  Result Value Ref Range   SARS Coronavirus 2 by RT PCR NEGATIVE NEGATIVE    Comment: (NOTE) SARS-CoV-2 target nucleic acids are NOT DETECTED.  The SARS-CoV-2 RNA is generally detectable in upper respiratory specimens during the acute phase of infection. The lowest concentration of SARS-CoV-2 viral copies this assay can detect is 138 copies/mL. A negative result does not preclude SARS-Cov-2 infection and should not be used as the sole basis for treatment or other patient management decisions. A negative result may occur with  improper specimen collection/handling, submission of specimen other than nasopharyngeal swab, presence of viral mutation(s) within the areas targeted by this assay, and inadequate number of viral copies(<138 copies/mL). A negative result must be combined with clinical observations, patient history, and epidemiological information. The expected result is Negative.  Fact Sheet for Patients:  https://www.fda.gov/media/152166/download  Fact Sheet for Healthcare Providers:  https://www.fda.gov/media/152162/download  This test is no t yet approved or cleared by the [place] FDA and  has been authorized for detection and/or diagnosis of SARS-CoV-2 by FDA under an Emergency Use Authorization (EUA). This EUA will remain  in effect (meaning this test  can be used) for the duration of the COVID-19 declaration under Section 564(b)(1) of the Act, 21 U.S.C.section 360bbb-3(b)(1), unless the authorization is terminated  or revoked sooner.       Influenza A by PCR NEGATIVE NEGATIVE   Influenza B by PCR NEGATIVE NEGATIVE    Comment: (NOTE) The Xpert Xpress SARS-CoV-2/FLU/RSV plus assay is intended as an aid in the diagnosis of influenza from Nasopharyngeal swab specimens and should not be used as a sole   basis for treatment. Nasal washings and aspirates are unacceptable for Xpert Xpress SARS-CoV-2/FLU/RSV testing.  Fact Sheet for Patients: BloggerCourse.com  Fact Sheet for Healthcare Providers: SeriousBroker.it  This test is not yet approved or cleared by the [place] FDA and has been authorized for detection and/or diagnosis of SARS-CoV-2 by FDA under an Emergency Use Authorization (EUA). This EUA will remain in effect (meaning this test can be used) for the duration of the COVID-19 declaration under Section 564(b)(1) of the Act, 21 U.S.C. section 360bbb-3(b)(1), unless the authorization is terminated or revoked.  Performed at [facility], [address] [place], [place] [address]   Blood gas, venous     Status: Abnormal   Collection Time: 10/29/20 12:34 PM  Result Value Ref Range   FIO2 21.00    pH, Ven 7.484 (H) 7.250 - 7.430   pCO2, Ven 34.2 (L) 44.0 - 60.0 mmHg   pO2, Ven 41.0 32.0 - 45.0 mmHg   Bicarbonate 26.4 20.0 - 28.0 mmol/L   Acid-Base Excess 2.1 (H) 0.0 - 2.0 mmol/L   O2 Saturation 76.8 %   Patient temperature 37.6     Comment: Performed at [facility], [address], [place], [place] [address]  Ammonia     Status: None   Collection Time: 10/29/20 12:34 PM  Result Value Ref Range   Ammonia 25 9 - 35 umol/L    Comment: Performed at [facility], [address], [place], [place] [address]  hCG, serum, qualitative     Status: None   Collection Time: 10/29/20  12:34 PM  Result Value Ref Range   Preg, Serum NEGATIVE NEGATIVE    Comment:        THE SENSITIVITY OF THIS METHODOLOGY IS >10 mIU/mL. Performed at [facility], [address] [place], [place] [address]     CT Abdomen Pelvis W Contrast  Result Date: 10/29/2020 CLINICAL DATA:  Acute abdominal pain with nausea EXAM: CT ABDOMEN AND PELVIS WITH CONTRAST TECHNIQUE: Multidetector CT imaging of the abdomen and pelvis was performed using the standard protocol following bolus administration of intravenous contrast. CONTRAST:  OMNIPAQUE IOHEXOL 300 MG/ML  SOLN COMPARISON:  None. FINDINGS: Lower chest: Bibasilar and lingula atelectasis. No pleural or pericardial effusion. Normal heart size. Hepatobiliary: No focal hepatic abnormality or biliary dilatation. Hepatic and portal veins are patent. Gallbladder and biliary tree unremarkable. Common bile duct nondilated. Trace perihepatic fluid along the right inferior liver. Pancreas: Unremarkable. No pancreatic ductal dilatation or surrounding inflammatory changes. Spleen: No focal abnormality. Normal size. Trace left upper quadrant subdiaphragmatic and perisplenic free fluid. Adrenals/Urinary Tract: Normal adrenal glands. Small bilateral subcentimeter renal cysts. No renal obstruction or hydronephrosis. No hydroureter or ureteral calculus. Bladder unremarkable. Stomach/Bowel: Negative for bowel obstruction, significant dilatation, or free air. In the right lower quadrant, the appendix is dilated with fluid distension, mucosal enhancement and small radiodense appendicoliths compatible with appendicitis. Additionally, several loops of mid and distal small bowel demonstrate slight wall thickening and mucosal enhancement. Similar wall thickening and enhancement of the cecum. Central mesentery demonstrates diffuse edema/inflammation with a few scattered small areas of mesenteric interloop free fluid. This remains nonspecific but suggest a secondary associated  enterocolitis/peritonitis pattern. Vascular/Lymphatic: Intact aorta. Infrarenal atherosclerotic change. No aneurysm or occlusive process. No retroperitoneal hemorrhage or hematoma. Mesenteric and renal vasculature appear to remain patent. No veno-occlusive process. No bulky adenopathy. Reproductive: IUD within the midline of the uterus endometrial cavity. Uterus and adnexa normal in size. Trace pelvic free fluid as well. Other: Intact  abdominal wall. Musculoskeletal: Degenerative changes of the spine. Lower lumbar facet arthropathy. No acute osseous finding. IMPRESSION: Acute appendicitis with associated appendicoliths. Diffuse mid and distal small bowel and cecal wall thickening with mucosal enhancement as well as central mesentery edema and scattered areas of mesenteric interloop free fluid. Appearance is compatible with associated enterocolitis/peritonitis. No discernible abscess, free air or evidence of perforation. These results were called by telephone at the time of interpretation on 10/29/2020 at 2:27 pm to provider ELLIOTT WENTZ , who verbally acknowledged these results. Electronically Signed   By: M.  Shick M.D.   On: 10/29/2020 14:29    ROS:  Pertinent items are noted in HPI.  Blood pressure 117/73, pulse (!) 114, temperature (!) 100.6 F (38.1 C), temperature source Oral, resp. rate (!) 32, height 5' 4" (1.626 m), weight 97.5 kg, SpO2 96 %. Physical Exam: Pleasant white female no acute distress Head is normocephalic, atraumatic Lungs clear to auscultation with good breath sounds bilaterally Heart examination reveals a tachycardic but regular rate and rhythm Abdomen soft with tenderness noted in the right lower quadrant to palpation.  No rigidity is noted.  CT scan images personally reviewed  Assessment/Plan: Impression: Acute appendicitis with appendicoliths, surrounding enterocolitis, hyponatremia, hypokalemia, slightly elevated creatinine Plan: Will correct electrolyte and  intravascular volume prior to laparoscopic cholecystectomy.  The patient will undergo laparoscopic cholecystectomy, possible open procedure in the a.m.  The risks and benefits of the procedure including bleeding, infection, the possibility of an open procedure, and the possibility of limited bowel resection were fully explained to the patient, who gave informed consent.  Sumedh Shinsato 10/29/2020, 4:10 PM   

## 2020-10-29 NOTE — ED Notes (Signed)
Pt reports increasing pain, pt requests pain med, pain med given, pt sleepy, pt placed on 2L O2 via [place].

## 2020-10-30 ENCOUNTER — Observation Stay (HOSPITAL_COMMUNITY): Payer: Medicaid Other | Admitting: Anesthesiology

## 2020-10-30 ENCOUNTER — Encounter (HOSPITAL_COMMUNITY)
Admission: EM | Disposition: A | Discharge status: Home / Self Care | Payer: Self-pay | Source: Home / Self Care | Attending: General Surgery

## 2020-10-30 ENCOUNTER — Encounter (HOSPITAL_COMMUNITY): Payer: Self-pay | Admitting: General Surgery

## 2020-10-30 DIAGNOSIS — F319 Bipolar disorder, unspecified: Secondary | ICD-10-CM | POA: Diagnosis present

## 2020-10-30 DIAGNOSIS — E7800 Pure hypercholesterolemia, unspecified: Secondary | ICD-10-CM | POA: Diagnosis present

## 2020-10-30 DIAGNOSIS — K651 Peritoneal abscess: Secondary | ICD-10-CM | POA: Diagnosis not present

## 2020-10-30 DIAGNOSIS — K3532 Acute appendicitis with perforation and localized peritonitis, without abscess: Principal | ICD-10-CM

## 2020-10-30 DIAGNOSIS — R112 Nausea with vomiting, unspecified: Secondary | ICD-10-CM | POA: Diagnosis not present

## 2020-10-30 DIAGNOSIS — Z79890 Hormone replacement therapy (postmenopausal): Secondary | ICD-10-CM | POA: Diagnosis not present

## 2020-10-30 DIAGNOSIS — K9189 Other postprocedural complications and disorders of digestive system: Secondary | ICD-10-CM | POA: Diagnosis not present

## 2020-10-30 DIAGNOSIS — E871 Hypo-osmolality and hyponatremia: Secondary | ICD-10-CM | POA: Diagnosis present

## 2020-10-30 DIAGNOSIS — R1031 Right lower quadrant pain: Secondary | ICD-10-CM | POA: Diagnosis present

## 2020-10-30 DIAGNOSIS — I10 Essential (primary) hypertension: Secondary | ICD-10-CM | POA: Diagnosis present

## 2020-10-30 DIAGNOSIS — Z20822 Contact with and (suspected) exposure to covid-19: Secondary | ICD-10-CM | POA: Diagnosis present

## 2020-10-30 DIAGNOSIS — K567 Ileus, unspecified: Secondary | ICD-10-CM | POA: Diagnosis not present

## 2020-10-30 DIAGNOSIS — Z79899 Other long term (current) drug therapy: Secondary | ICD-10-CM | POA: Diagnosis not present

## 2020-10-30 DIAGNOSIS — E039 Hypothyroidism, unspecified: Secondary | ICD-10-CM | POA: Diagnosis present

## 2020-10-30 DIAGNOSIS — E876 Hypokalemia: Secondary | ICD-10-CM | POA: Diagnosis present

## 2020-10-30 DIAGNOSIS — K810 Acute cholecystitis: Secondary | ICD-10-CM | POA: Diagnosis present

## 2020-10-30 DIAGNOSIS — F17210 Nicotine dependence, cigarettes, uncomplicated: Secondary | ICD-10-CM | POA: Diagnosis present

## 2020-10-30 HISTORY — PX: LAPAROSCOPIC APPENDECTOMY: SHX408

## 2020-10-30 LAB — CBC
HCT: 43.1 % (ref 36.0–46.0)
Hemoglobin: 14.4 g/dL (ref 12.0–15.0)
MCH: 31.6 pg (ref 26.0–34.0)
MCHC: 33.4 g/dL (ref 30.0–36.0)
MCV: 94.7 fL (ref 80.0–100.0)
Platelets: 275 10*3/uL (ref 150–400)
RBC: 4.55 MIL/uL (ref 3.87–5.11)
RDW: 13.2 % (ref 11.5–15.5)
WBC: 14.8 10*3/uL — ABNORMAL HIGH (ref 4.0–10.5)
nRBC: 0 % (ref 0.0–0.2)

## 2020-10-30 LAB — BASIC METABOLIC PANEL
Anion gap: 11 (ref 5–15)
BUN: 9 mg/dL (ref 6–20)
CO2: 24 mmol/L (ref 22–32)
Calcium: 8.2 mg/dL — ABNORMAL LOW (ref 8.9–10.3)
Chloride: 96 mmol/L — ABNORMAL LOW (ref 98–111)
Creatinine, Ser: 0.75 mg/dL (ref 0.44–1.00)
GFR, Estimated: >60 mL/min (ref >60)
Glucose, Bld: 120 mg/dL — ABNORMAL HIGH (ref 70–99)
Potassium: 3.4 mmol/L — ABNORMAL LOW (ref 3.5–5.1)
Sodium: 131 mmol/L — ABNORMAL LOW (ref 135–145)

## 2020-10-30 LAB — MAGNESIUM: Magnesium: 2.7 mg/dL — ABNORMAL HIGH (ref 1.7–2.4)

## 2020-10-30 LAB — SURGICAL PCR SCREEN
MRSA, PCR: NEGATIVE
Staphylococcus aureus: NEGATIVE

## 2020-10-30 LAB — PHOSPHORUS: Phosphorus: 1.6 mg/dL — ABNORMAL LOW (ref 2.5–4.6)

## 2020-10-30 SURGERY — APPENDECTOMY, LAPAROSCOPIC
Anesthesia: General

## 2020-10-30 MED ORDER — ENOXAPARIN SODIUM 40 MG/0.4ML ~~LOC~~ SOLN
40.0000 mg | SUBCUTANEOUS | Status: DC
  Administered 2020-10-30 – 2020-10-31 (×2): 40 mg via SUBCUTANEOUS
  Filled 2020-10-30: qty 0.4

## 2020-10-30 MED ORDER — DIPHENHYDRAMINE HCL 25 MG PO CAPS: 25.0000 mg | ORAL_CAPSULE | Freq: Four times a day (QID) | ORAL | Status: DC | PRN

## 2020-10-30 MED ORDER — FENTANYL CITRATE (PF) 250 MCG/5ML IJ SOLN
INTRAMUSCULAR | Status: AC
  Filled 2020-10-30: qty 5

## 2020-10-30 MED ORDER — CHLORHEXIDINE GLUCONATE 0.12 % MT SOLN
15.0000 mL | Freq: Once | OROMUCOSAL | Status: AC
  Administered 2020-10-30: 13:00:00 15 mL via OROMUCOSAL

## 2020-10-30 MED ORDER — PHENYLEPHRINE 40 MCG/ML (10ML) SYRINGE FOR IV PUSH (FOR BLOOD PRESSURE SUPPORT)
PREFILLED_SYRINGE | INTRAVENOUS | Status: AC
  Filled 2020-10-30: qty 10

## 2020-10-30 MED ORDER — MUPIROCIN 2 % EX OINT
1.0000 "application " | TOPICAL_OINTMENT | Freq: Two times a day (BID) | CUTANEOUS | Status: DC
  Administered 2020-10-30 – 2020-10-31 (×2): 1 via NASAL
  Filled 2020-10-30: qty 22

## 2020-10-30 MED ORDER — CHLORHEXIDINE GLUCONATE CLOTH 2 % EX PADS: 6.0000 | MEDICATED_PAD | Freq: Once | CUTANEOUS | Status: DC

## 2020-10-30 MED ORDER — SODIUM CHLORIDE 0.9 % IV SOLN: INTRAVENOUS | Status: DC

## 2020-10-30 MED ORDER — SIMETHICONE 80 MG PO CHEW: 40.0000 mg | CHEWABLE_TABLET | Freq: Four times a day (QID) | ORAL | Status: DC | PRN

## 2020-10-30 MED ORDER — MIDAZOLAM HCL 2 MG/2ML IJ SOLN: 0.5000 mg | Freq: Once | INTRAMUSCULAR | Status: DC | PRN

## 2020-10-30 MED ORDER — MIDAZOLAM HCL 5 MG/5ML IJ SOLN
INTRAMUSCULAR | Status: DC | PRN
  Administered 2020-10-30: 2 mg via INTRAVENOUS

## 2020-10-30 MED ORDER — ONDANSETRON HCL 4 MG/2ML IJ SOLN
INTRAMUSCULAR | Status: DC | PRN
  Administered 2020-10-30: 4 mg via INTRAVENOUS

## 2020-10-30 MED ORDER — BUPIVACAINE LIPOSOME 1.3 % IJ SUSP
INTRAMUSCULAR | Status: DC | PRN
  Administered 2020-10-30: 20 mL

## 2020-10-30 MED ORDER — KETOROLAC TROMETHAMINE 30 MG/ML IJ SOLN
30.0000 mg | Freq: Once | INTRAMUSCULAR | Status: AC
  Administered 2020-10-30: 14:00:00 30 mg via INTRAVENOUS
  Filled 2020-10-30: qty 1

## 2020-10-30 MED ORDER — FENTANYL CITRATE (PF) 100 MCG/2ML IJ SOLN: 25.0000 ug | INTRAMUSCULAR | Status: DC | PRN

## 2020-10-30 MED ORDER — FENTANYL CITRATE (PF) 250 MCG/5ML IJ SOLN
INTRAMUSCULAR | Status: DC | PRN
  Administered 2020-10-30: 50 ug via INTRAVENOUS
  Administered 2020-10-30: 100 ug via INTRAVENOUS
  Administered 2020-10-30: 50 ug via INTRAVENOUS

## 2020-10-30 MED ORDER — PHENYLEPHRINE HCL (PRESSORS) 10 MG/ML IV SOLN
INTRAVENOUS | Status: DC | PRN
  Administered 2020-10-30: 80 ug via INTRAVENOUS

## 2020-10-30 MED ORDER — MIDAZOLAM HCL 2 MG/2ML IJ SOLN
INTRAMUSCULAR | Status: AC
  Filled 2020-10-30: qty 2

## 2020-10-30 MED ORDER — BUPIVACAINE LIPOSOME 1.3 % IJ SUSP
INTRAMUSCULAR | Status: AC
  Filled 2020-10-30: qty 20

## 2020-10-30 MED ORDER — LACTATED RINGERS IV SOLN: INTRAVENOUS | Status: DC

## 2020-10-30 MED ORDER — ALUM & MAG HYDROXIDE-SIMETH 200-200-20 MG/5ML PO SUSP: 30.0000 mL | Freq: Four times a day (QID) | ORAL | Status: DC | PRN

## 2020-10-30 MED ORDER — POVIDONE-IODINE 10 % EX OINT
TOPICAL_OINTMENT | CUTANEOUS | Status: AC
  Filled 2020-10-30: qty 1

## 2020-10-30 MED ORDER — PROPOFOL 10 MG/ML IV BOLUS
INTRAVENOUS | Status: AC
  Filled 2020-10-30: qty 20

## 2020-10-30 MED ORDER — DIPHENHYDRAMINE HCL 50 MG/ML IJ SOLN: 25.0000 mg | Freq: Four times a day (QID) | INTRAMUSCULAR | Status: DC | PRN

## 2020-10-30 MED ORDER — ONDANSETRON HCL 4 MG/2ML IJ SOLN: 4.0000 mg | Freq: Once | INTRAMUSCULAR | Status: DC | PRN

## 2020-10-30 MED ORDER — PROPOFOL 10 MG/ML IV BOLUS
INTRAVENOUS | Status: DC | PRN
  Administered 2020-10-30: 100 mg via INTRAVENOUS

## 2020-10-30 MED ORDER — CHLORHEXIDINE GLUCONATE CLOTH 2 % EX PADS
6.0000 | MEDICATED_PAD | Freq: Once | CUTANEOUS | Status: AC
  Administered 2020-10-30: 06:00:00 6 via TOPICAL

## 2020-10-30 MED ORDER — LIDOCAINE HCL (CARDIAC) PF 100 MG/5ML IV SOSY
PREFILLED_SYRINGE | INTRAVENOUS | Status: DC | PRN
  Administered 2020-10-30: 100 mg via INTRAVENOUS

## 2020-10-30 MED ORDER — ORAL CARE MOUTH RINSE: 15.0000 mL | Freq: Once | OROMUCOSAL | Status: AC

## 2020-10-30 MED ORDER — LEVOTHYROXINE SODIUM 112 MCG PO TABS
112.0000 ug | ORAL_TABLET | Freq: Every day | ORAL | Status: DC
  Administered 2020-10-31: 06:00:00 112 ug via ORAL
  Filled 2020-10-30 (×2): qty 1

## 2020-10-30 MED ORDER — ROCURONIUM BROMIDE 10 MG/ML (PF) SYRINGE
PREFILLED_SYRINGE | INTRAVENOUS | Status: DC | PRN
  Administered 2020-10-30: 40 mg via INTRAVENOUS

## 2020-10-30 MED ORDER — PHOSPHA 250 NEUTRAL 155-852-130 MG PO TABS
500.0000 mg | ORAL_TABLET | Freq: Three times a day (TID) | ORAL | Status: DC
  Administered 2020-10-30 – 2020-10-31 (×2): 500 mg via ORAL
  Filled 2020-10-30 (×6): qty 2

## 2020-10-30 MED ORDER — SODIUM CHLORIDE 0.9 % IR SOLN
Status: DC | PRN
  Administered 2020-10-30: 1000 mL

## 2020-10-30 MED ORDER — OXYCODONE-ACETAMINOPHEN 5-325 MG PO TABS
1.0000 | ORAL_TABLET | ORAL | Status: DC | PRN
  Administered 2020-10-31: 1 via ORAL
  Filled 2020-10-30: qty 2

## 2020-10-30 SURGICAL SUPPLY — 58 items
ADH SKN CLS APL DERMABOND .7 (GAUZE/BANDAGES/DRESSINGS) ×1
APL PRP STRL LF DISP 70% ISPRP (MISCELLANEOUS) ×1
BAG RETRIEVAL 10 (BASKET) ×1
BAG RETRIEVAL 10MM (BASKET) ×1
CHLORAPREP W/TINT 26 (MISCELLANEOUS) ×3 IMPLANT
CLOTH BEACON ORANGE TIMEOUT ST (SAFETY) ×3 IMPLANT
COVER LIGHT HANDLE STERIS (MISCELLANEOUS) ×6 IMPLANT
COVER WAND RF STERILE (DRAPES) ×3 IMPLANT
CUTTER FLEX LINEAR 45M (STAPLE) ×3 IMPLANT
DERMABOND ADVANCED (GAUZE/BANDAGES/DRESSINGS) ×2
DERMABOND ADVANCED .7 DNX12 (GAUZE/BANDAGES/DRESSINGS) ×1 IMPLANT
ELECT REM PT RETURN 9FT ADLT (ELECTROSURGICAL) ×3
ELECTRODE REM PT RTRN 9FT ADLT (ELECTROSURGICAL) ×1 IMPLANT
EVACUATOR DRAINAGE 10X20 100CC (DRAIN) IMPLANT
EVACUATOR SILICONE 100CC (DRAIN) ×3
GLOVE BIOGEL PI IND STRL 7.0 (GLOVE) ×3 IMPLANT
GLOVE BIOGEL PI INDICATOR 7.0 (GLOVE) ×6
GLOVE SURG SS PI 7.5 STRL IVOR (GLOVE) ×3 IMPLANT
GOWN STRL REUS W/TWL LRG LVL3 (GOWN DISPOSABLE) ×6 IMPLANT
INST SET LAPROSCOPIC AP (KITS) ×3 IMPLANT
KIT TURNOVER KIT A (KITS) ×3 IMPLANT
MANIFOLD NEPTUNE II (INSTRUMENTS) ×3 IMPLANT
NDL HYPO 18GX1.5 BLUNT FILL (NEEDLE) ×1 IMPLANT
NDL HYPO 21X1.5 SAFETY (NEEDLE) ×1 IMPLANT
NDL INSUFFLATION 14GA 120MM (NEEDLE) ×1 IMPLANT
NEEDLE HYPO 18GX1.5 BLUNT FILL (NEEDLE) ×3 IMPLANT
NEEDLE HYPO 21X1.5 SAFETY (NEEDLE) ×6 IMPLANT
NEEDLE INSUFFLATION 14GA 120MM (NEEDLE) ×3 IMPLANT
NS IRRIG 1000ML POUR BTL (IV SOLUTION) ×3 IMPLANT
PACK LAP CHOLE LZT030E (CUSTOM PROCEDURE TRAY) ×3 IMPLANT
PAD ARMBOARD 7.5X6 YLW CONV (MISCELLANEOUS) ×3 IMPLANT
PENCIL SMOKE EVACUATOR COATED (MISCELLANEOUS) ×3 IMPLANT
RELOAD 45 VASCULAR/THIN (ENDOMECHANICALS) ×6 IMPLANT
RELOAD STAPLE 45 2.5 WHT GRN (ENDOMECHANICALS) IMPLANT
RELOAD STAPLE 45 3.5 BLU ETS (ENDOMECHANICALS) IMPLANT
RELOAD STAPLE TA45 3.5 REG BLU (ENDOMECHANICALS) IMPLANT
SET BASIN LINEN APH (SET/KITS/TRAYS/PACK) ×3 IMPLANT
SET TUBE IRRIG SUCTION NO TIP (IRRIGATION / IRRIGATOR) ×2 IMPLANT
SET TUBE SMOKE EVAC HIGH FLOW (TUBING) ×3 IMPLANT
SHEARS HARMONIC ACE PLUS 36CM (ENDOMECHANICALS) ×3 IMPLANT
SPONGE DRAIN TRACH 4X4 STRL 2S (GAUZE/BANDAGES/DRESSINGS) ×2 IMPLANT
SPONGE GAUZE 2X2 8PLY STER LF (GAUZE/BANDAGES/DRESSINGS) ×2
SPONGE GAUZE 2X2 8PLY STRL LF (GAUZE/BANDAGES/DRESSINGS) ×2 IMPLANT
STAPLER VISISTAT (STAPLE) ×2 IMPLANT
SUT ETHILON 3 0 FSL (SUTURE) ×2 IMPLANT
SUT MNCRL AB 4-0 PS2 18 (SUTURE) ×8 IMPLANT
SUT VICRYL 0 UR6 27IN ABS (SUTURE) ×5 IMPLANT
SYR 20ML LL LF (SYRINGE) ×6 IMPLANT
SYS BAG RETRIEVAL 10MM (BASKET) ×1
SYSTEM BAG RETRIEVAL 10MM (BASKET) ×1 IMPLANT
TRAY FOLEY W/BAG SLVR 16FR (SET/KITS/TRAYS/PACK) ×3
TRAY FOLEY W/BAG SLVR 16FR ST (SET/KITS/TRAYS/PACK) ×1 IMPLANT
TROCAR ENDO BLADELESS 11MM (ENDOMECHANICALS) ×3 IMPLANT
TROCAR ENDO BLADELESS 12MM (ENDOMECHANICALS) ×3 IMPLANT
TROCAR XCEL NON-BLD 5MMX100MML (ENDOMECHANICALS) ×3 IMPLANT
TUBE CONNECTING 12'X1/4 (SUCTIONS) ×1
TUBE CONNECTING 12X1/4 (SUCTIONS) ×2 IMPLANT
WARMER LAPAROSCOPE (MISCELLANEOUS) ×3 IMPLANT

## 2020-10-30 NOTE — Op Note (Signed)
Patient:  Evelyn Simpson  DOB:  24-Jan-1976  MRN:  [id number]   Preop Diagnosis: Acute appendicitis  Postop Diagnosis: Gangrenous appendicitis with perforation  Procedure: Laparoscopic appendectomy  Surgeon: Franky Macho, MD  Anes: General endotracheal  Indications: Patient is a 44 year old white female who presented with a 2-day history of worsening right lower quadrant abdominal pain.  CT scan of the abdomen revealed acute appendicitis with surrounding fluid and enterocolitis.  Once the patient's electrolytes and hydration were restored, the patient was brought down to the operating room for laparoscopic appendectomy.  The risks and benefits of the procedure including bleeding, infection, and the possibility of an open procedure were fully explained to the patient, who gave informed consent.  Procedure note: The patient was placed in the supine position.  After induction of general endotracheal anesthesia, the abdomen was prepped and draped using the usual sterile technique with DuraPrep.  Surgical site confirmation was performed.  A supraumbilical incision was made down to the fascia.  A Veress needle was introduced into the abdominal cavity and confirmation of placement was done using the saline drop test.  The abdomen was then insufflated to 15 mmHg pressure.  An 11 mm trocar was introduced into the abdominal cavity under direct visualization without difficulty.  The patient was placed in deeper Trendelenburg position and an additional 12 mm trocar was placed in the suprapubic region and a 5 mm trocar was placed left lower quadrant region.  There was cloudy free fluid noted in the pelvis.  The appendix was visualized and approximately 80% of it was gangrenous with a single stool ball noted extraluminally.  The mesoappendix was divided using the harmonic scalpel.  A vascular Endo GIA was placed across the juncture of the appendix to the cecum and fired.  The appendix was then removed using an  Endo Catch bag without difficulty.  The staple line was inspected and noted to be within normal limits.  Approximately 2 L of fluid were used to irrigate the right lower quadrant and pelvis.  A #10 flat Jackson-Pratt drain was placed into the right gutter and pelvis and brought out through the 5 mm trocar site.  It was secured at the skin level using a 3-0 nylon interrupted suture.  All fluid and air were then evacuated from the abdominal cavity prior to removal of the trochars.  All wounds were irrigated with normal saline.  All wounds were injected with Exparel.  The supraumbilical fascia was reapproximated using an 0 Vicryl interrupted suture.  All skin incisions were closed using staples.  Betadine ointment and dry sterile dressings were applied.  All tape and needle counts were correct at the end of the procedure.  Patient was extubated in the operating room and transferred to PACU in stable condition.  Complications: None  EBL: Minimal  Specimen: Appendix  Drains: Jackson-Pratt drain to right gutter and pelvis

## 2020-10-30 NOTE — Transfer of Care (Signed)
Immediate Anesthesia Transfer of Care Note  Patient: Evelyn Simpson  Procedure(s) Performed: APPENDECTOMY LAPAROSCOPIC (N/A )  Patient Location: PACU  Anesthesia Type:General  Level of Consciousness: awake, alert , oriented and patient cooperative  Airway & Oxygen Therapy: Patient Spontanous Breathing and Patient connected to nasal cannula oxygen  Post-op Assessment: Report given to RN and Post -op Vital signs reviewed and stable  Post vital signs: Reviewed and stable  Last Vitals:  Vitals Value Taken Time  BP 128/62 10/30/20 1353  Temp    Pulse 120 10/30/20 1355  Resp 37 10/30/20 1355  SpO2 93 % 10/30/20 1355  Vitals shown include unvalidated device data.  Last Pain:  Vitals:   10/30/20 1119  TempSrc: Oral  PainSc: 0-No pain      Patients Stated Pain Goal: 2 (10/30/20 8546)  Complications: No complications documented.

## 2020-10-30 NOTE — Interval H&P Note (Signed)
History and Physical Interval Note:  10/30/2020 11:45 AM  Evelyn Simpson  has presented today for surgery, with the diagnosis of acute appendicitis.  The various methods of treatment have been discussed with the patient and family. After consideration of risks, benefits and other options for treatment, the patient has consented to  Procedure(s): APPENDECTOMY LAPAROSCOPIC (N/A) as a surgical intervention.  The patient's history has been reviewed, patient examined, no change in status, stable for surgery.  I have reviewed the patient's chart and labs.  Questions were answered to the patient's satisfaction.     Franky Macho

## 2020-10-30 NOTE — Anesthesia Postprocedure Evaluation (Signed)
Anesthesia Post Note  Patient: Evelyn Simpson  Procedure(s) Performed: APPENDECTOMY LAPAROSCOPIC (N/A )  Patient location during evaluation: PACU Anesthesia Type: General Level of consciousness: awake and alert and oriented Pain management: pain level controlled Vital Signs Assessment: post-procedure vital signs reviewed and stable Respiratory status: spontaneous breathing, respiratory function stable and patient connected to nasal cannula oxygen Cardiovascular status: blood pressure returned to baseline, tachycardic and stable Postop Assessment: no headache, no backache, no apparent nausea or vomiting and adequate PO intake Anesthetic complications: no   No complications documented.   Last Vitals:  Vitals:   10/30/20 0819 10/30/20 1119  BP: 114/79 132/72  Pulse: (!) 116 (!) 122  Resp: 20 (!) 39  Temp: 37.4 C 37.4 C  SpO2: 96% (!) 88%    Last Pain:  Vitals:   10/30/20 1119  TempSrc: Oral  PainSc: 0-No pain                 Marquerite Forsman

## 2020-10-30 NOTE — H&P (Signed)
Reason for Consult: Acute appendicitis Referring Physician: Dr. Effie Shy, ER  Evelyn Simpson is an 44 y.o. female.  HPI: Patient is a 44 year old white female who presented to the emergency room with a 48-hour history of worsening right lower quadrant abdominal pain.  She has a longstanding history of intermittent constipation.  She was seen by an urgent care center yesterday but was told that she was constipated and was given milk of magnesia to take at home.  As her pain worsened overnight, she presented to the emergency room and CT scan of the abdomen reveals acute appendicitis with appendicoliths and a significant amount of surrounding enterocolitis.  There is no abscess cavity present.  There is no evidence of perforation.  She has had nausea and vomiting.  Her appetite is decreased.  She denies any fever or chills.  Past Medical History:  Diagnosis Date  . ADD (attention deficit disorder)   . Anxiety   . Bipolar 1 disorder (HCC)   . Depression   . HTN (hypertension)   . Hypercholesteremia   . Hypothyroidism     Past Surgical History:  Procedure Laterality Date  . CERVIX SURGERY    . COLONOSCOPY WITH PROPOFOL N/A 05/10/2019   Procedure: COLONOSCOPY WITH PROPOFOL;  Surgeon: Corbin Ade, MD;  Location: [facility];  Service: Endoscopy;  Laterality: N/A;  2:30pm - pt does not want to move up  . HAND EXPLORATION Left    removal of glass from finger  . MANDIBLE SURGERY      Family History  Problem Relation Age of Onset  . Breast cancer Mother   . Liver disease Neg Hx   . Colon cancer Neg Hx     Social History:  reports that she has been smoking cigarettes. She has a 28.00 pack-year smoking history. She has never used smokeless tobacco. She reports current alcohol use. She reports previous drug use.  Allergies: No Known Allergies  Medications: I have reviewed the patient's current medications.  Results for orders placed or performed during the hospital encounter of 10/29/20  (from the past 48 hour(s))  Comprehensive metabolic panel     Status: Abnormal   Collection Time: 10/29/20 12:27 PM  Result Value Ref Range   Sodium 129 (L) 135 - 145 mmol/L   Potassium 3.2 (L) 3.5 - 5.1 mmol/L   Chloride 89 (L) 98 - 111 mmol/L   CO2 26 22 - 32 mmol/L   Glucose, Bld 150 (H) 70 - 99 mg/dL    Comment: Glucose reference range applies only to samples taken after fasting for at least 8 hours.   BUN 13 6 - 20 mg/dL   Creatinine, Ser 0.62 (H) 0.44 - 1.00 mg/dL   Calcium 8.6 (L) 8.9 - 10.3 mg/dL   Total Protein 8.1 6.5 - 8.1 g/dL   Albumin 4.0 3.5 - 5.0 g/dL   AST 26 15 - 41 U/L   ALT 32 0 - 44 U/L   Alkaline Phosphatase 76 38 - 126 U/L   Total Bilirubin 1.2 0.3 - 1.2 mg/dL   GFR, Estimated >37 >62 mL/min    Comment: (NOTE) Calculated using the CKD-EPI Creatinine Equation (2021)    Anion gap 14 5 - 15    Comment: Performed at [facility], [address], [place], [place] [address]  CBC with Differential     Status: Abnormal   Collection Time: 10/29/20 12:27 PM  Result Value Ref Range   WBC 15.5 (H) 4.0 - 10.5 K/uL  Comment: REPEATED TO VERIFY WHITE COUNT CONFIRMED ON SMEAR    RBC 4.84 3.87 - 5.11 MIL/uL   Hemoglobin 15.4 (H) 12.0 - 15.0 g/dL   HCT 81.144.8 91.436.0 - 78.246.0 %   MCV 92.6 80.0 - 100.0 fL   MCH 31.8 26.0 - 34.0 pg   MCHC 34.4 30.0 - 36.0 g/dL   RDW 95.612.9 21.311.5 - 08.615.5 %   Platelets 308 150 - 400 K/uL   nRBC 0.0 0.0 - 0.2 %    Comment: REPEATED TO VERIFY   Neutrophils Relative % 50 %   Neutro Abs 10.5 (H) 1.7 - 7.7 K/uL   Band Neutrophils 18 %   Lymphocytes Relative 9 %   Lymphs Abs 1.4 0.7 - 4.0 K/uL   Monocytes Relative 13 %   Monocytes Absolute 2.0 (H) 0.1 - 1.0 K/uL   Eosinophils Relative 0 %   Eosinophils Absolute 0.0 0.0 - 0.5 K/uL   Basophils Relative 0 %   Basophils Absolute 0.0 0.0 - 0.1 K/uL   WBC Morphology MILD LEFT SHIFT (1-5% METAS, OCC MYELO, OCC BANDS)     Comment: TOXIC GRANULATION VACUOLATED NEUTROPHILS    Metamyelocytes Relative 5  %   Myelocytes 4 %   Promyelocytes Relative 1 %    Comment: Performed at [facility], [address] [place], [place] [address]  Ethanol     Status: None   Collection Time: 10/29/20 12:27 PM  Result Value Ref Range   Alcohol, Ethyl (B) <10 <10 mg/dL    Comment: (NOTE) Lowest detectable limit for serum alcohol is 10 mg/dL.  For medical purposes only. Performed at [facility], [address] [place], [place] [address]   Resp Panel by RT-PCR (Flu A&B, Covid) Nasopharyngeal Swab     Status: None   Collection Time: 10/29/20 12:31 PM   Specimen: Nasopharyngeal Swab; Nasopharyngeal(NP) swabs in vial transport medium  Result Value Ref Range   SARS Coronavirus 2 by RT PCR NEGATIVE NEGATIVE    Comment: (NOTE) SARS-CoV-2 target nucleic acids are NOT DETECTED.  The SARS-CoV-2 RNA is generally detectable in upper respiratory specimens during the acute phase of infection. The lowest concentration of SARS-CoV-2 viral copies this assay can detect is 138 copies/mL. A negative result does not preclude SARS-Cov-2 infection and should not be used as the sole basis for treatment or other patient management decisions. A negative result may occur with  improper specimen collection/handling, submission of specimen other than nasopharyngeal swab, presence of viral mutation(s) within the areas targeted by this assay, and inadequate number of viral copies(<138 copies/mL). A negative result must be combined with clinical observations, patient history, and epidemiological information. The expected result is Negative.  Fact Sheet for Patients:  BloggerCourse.comhttps://www.fda.gov/media/152166/download  Fact Sheet for Healthcare Providers:  SeriousBroker.ithttps://www.fda.gov/media/152162/download  This test is no t yet approved or cleared by the [place] FDA and  has been authorized for detection and/or diagnosis of SARS-CoV-2 by FDA under an Emergency Use Authorization (EUA). This EUA will remain  in effect (meaning this test  can be used) for the duration of the COVID-19 declaration under Section 564(b)(1) of the Act, 21 U.S.C.section 360bbb-3(b)(1), unless the authorization is terminated  or revoked sooner.       Influenza A by PCR NEGATIVE NEGATIVE   Influenza B by PCR NEGATIVE NEGATIVE    Comment: (NOTE) The Xpert Xpress SARS-CoV-2/FLU/RSV plus assay is intended as an aid in the diagnosis of influenza from Nasopharyngeal swab specimens and should not be used as a sole  basis for treatment. Nasal washings and aspirates are unacceptable for Xpert Xpress SARS-CoV-2/FLU/RSV testing.  Fact Sheet for Patients: BloggerCourse.com  Fact Sheet for Healthcare Providers: SeriousBroker.it  This test is not yet approved or cleared by the [place] FDA and has been authorized for detection and/or diagnosis of SARS-CoV-2 by FDA under an Emergency Use Authorization (EUA). This EUA will remain in effect (meaning this test can be used) for the duration of the COVID-19 declaration under Section 564(b)(1) of the Act, 21 U.S.C. section 360bbb-3(b)(1), unless the authorization is terminated or revoked.  Performed at [facility], [address] [place], [place] [address]   Blood gas, venous     Status: Abnormal   Collection Time: 10/29/20 12:34 PM  Result Value Ref Range   FIO2 21.00    pH, Ven 7.484 (H) 7.250 - 7.430   pCO2, Ven 34.2 (L) 44.0 - 60.0 mmHg   pO2, Ven 41.0 32.0 - 45.0 mmHg   Bicarbonate 26.4 20.0 - 28.0 mmol/L   Acid-Base Excess 2.1 (H) 0.0 - 2.0 mmol/L   O2 Saturation 76.8 %   Patient temperature 37.6     Comment: Performed at [facility], [address], [place], [place] [address]  Ammonia     Status: None   Collection Time: 10/29/20 12:34 PM  Result Value Ref Range   Ammonia 25 9 - 35 umol/L    Comment: Performed at [facility], [address], [place], [place] [address]  hCG, serum, qualitative     Status: None   Collection Time: 10/29/20  12:34 PM  Result Value Ref Range   Preg, Serum NEGATIVE NEGATIVE    Comment:        THE SENSITIVITY OF THIS METHODOLOGY IS >10 mIU/mL. Performed at [facility], [address] [place], [place] [address]     CT Abdomen Pelvis W Contrast  Result Date: 10/29/2020 CLINICAL DATA:  Acute abdominal pain with nausea EXAM: CT ABDOMEN AND PELVIS WITH CONTRAST TECHNIQUE: Multidetector CT imaging of the abdomen and pelvis was performed using the standard protocol following bolus administration of intravenous contrast. CONTRAST:  OMNIPAQUE IOHEXOL 300 MG/ML  SOLN COMPARISON:  None. FINDINGS: Lower chest: Bibasilar and lingula atelectasis. No pleural or pericardial effusion. Normal heart size. Hepatobiliary: No focal hepatic abnormality or biliary dilatation. Hepatic and portal veins are patent. Gallbladder and biliary tree unremarkable. Common bile duct nondilated. Trace perihepatic fluid along the right inferior liver. Pancreas: Unremarkable. No pancreatic ductal dilatation or surrounding inflammatory changes. Spleen: No focal abnormality. Normal size. Trace left upper quadrant subdiaphragmatic and perisplenic free fluid. Adrenals/Urinary Tract: Normal adrenal glands. Small bilateral subcentimeter renal cysts. No renal obstruction or hydronephrosis. No hydroureter or ureteral calculus. Bladder unremarkable. Stomach/Bowel: Negative for bowel obstruction, significant dilatation, or free air. In the right lower quadrant, the appendix is dilated with fluid distension, mucosal enhancement and small radiodense appendicoliths compatible with appendicitis. Additionally, several loops of mid and distal small bowel demonstrate slight wall thickening and mucosal enhancement. Similar wall thickening and enhancement of the cecum. Central mesentery demonstrates diffuse edema/inflammation with a few scattered small areas of mesenteric interloop free fluid. This remains nonspecific but suggest a secondary associated  enterocolitis/peritonitis pattern. Vascular/Lymphatic: Intact aorta. Infrarenal atherosclerotic change. No aneurysm or occlusive process. No retroperitoneal hemorrhage or hematoma. Mesenteric and renal vasculature appear to remain patent. No veno-occlusive process. No bulky adenopathy. Reproductive: IUD within the midline of the uterus endometrial cavity. Uterus and adnexa normal in size. Trace pelvic free fluid as well. Other: Intact  abdominal wall. Musculoskeletal: Degenerative changes of the spine. Lower lumbar facet arthropathy. No acute osseous finding. IMPRESSION: Acute appendicitis with associated appendicoliths. Diffuse mid and distal small bowel and cecal wall thickening with mucosal enhancement as well as central mesentery edema and scattered areas of mesenteric interloop free fluid. Appearance is compatible with associated enterocolitis/peritonitis. No discernible abscess, free air or evidence of perforation. These results were called by telephone at the time of interpretation on 10/29/2020 at 2:27 pm to provider [facility] , who verbally acknowledged these results. Electronically Signed   By: Judie Petit.  Shick M.D.   On: 10/29/2020 14:29    ROS:  Pertinent items are noted in HPI.  Blood pressure 117/73, pulse (!) 114, temperature (!) 100.6 F (38.1 C), temperature source Oral, resp. rate (!) 32, height 5\' 4"  (1.626 m), weight 97.5 kg, SpO2 96 %. Physical Exam: Pleasant white female no acute distress Head is normocephalic, atraumatic Lungs clear to auscultation with good breath sounds bilaterally Heart examination reveals a tachycardic but regular rate and rhythm Abdomen soft with tenderness noted in the right lower quadrant to palpation.  No rigidity is noted.  CT scan images personally reviewed  Assessment/Plan: Impression: Acute appendicitis with appendicoliths, surrounding enterocolitis, hyponatremia, hypokalemia, slightly elevated creatinine Plan: Will correct electrolyte and  intravascular volume prior to laparoscopic cholecystectomy.  The patient will undergo laparoscopic cholecystectomy, possible open procedure in the a.m.  The risks and benefits of the procedure including bleeding, infection, the possibility of an open procedure, and the possibility of limited bowel resection were fully explained to the patient, who gave informed consent.  10/29/2020, 4:10 PM

## 2020-10-30 NOTE — Interval H&P Note (Signed)
History and Physical Interval Note:  10/30/2020 12:15 PM  Evelyn Simpson  has presented today for surgery, with the diagnosis of acute appendicitis.  The various methods of treatment have been discussed with the patient and family. After consideration of risks, benefits and other options for treatment, the patient has consented to  Procedure(s): APPENDECTOMY LAPAROSCOPIC (N/A) as a surgical intervention.  The patient's history has been reviewed, patient examined, no change in status, stable for surgery.  I have reviewed the patient's chart and labs.  Questions were answered to the patient's satisfaction.     Franky Macho

## 2020-10-30 NOTE — OR Nursing (Signed)
Upon removal of the urethral catheter, there was blood noted around vaginal and urethral area. Not able to determine if bleeding was coming from urethra or vagina. There was no difficulty placing foley catheter per Virgie Dad. Urine appeared dark upon initial insertion. At time of removal, urine in bag was dark and tea colored but no frank blood noted in urine. Advised PACU nurse to continue to monitor and report to MD any further bleeding or changes.

## 2020-10-30 NOTE — Anesthesia Preprocedure Evaluation (Signed)
Anesthesia Evaluation  Patient identified by MRN, date of birth, ID band Patient awake    Reviewed: Allergy & Precautions, H&P , NPO status , Patient's Chart, lab work & pertinent test results, reviewed documented beta blocker date and time   Airway Mallampati: II  TM Distance: >3 FB Neck ROM: full    Dental no notable dental hx. (+) Teeth Intact   Pulmonary neg pulmonary ROS, Current Smoker,    Pulmonary exam normal breath sounds clear to auscultation       Cardiovascular Exercise Tolerance: Good hypertension, negative cardio ROS   Rhythm:regular Rate:Normal     Neuro/Psych PSYCHIATRIC DISORDERS Anxiety Depression Bipolar Disorder negative neurological ROS     GI/Hepatic negative GI ROS, Neg liver ROS,   Endo/Other  Hypothyroidism   Renal/GU negative Renal ROS  negative genitourinary   Musculoskeletal   Abdominal   Peds  Hematology negative hematology ROS (+)   Anesthesia Other Findings   Reproductive/Obstetrics negative OB ROS                             Anesthesia Physical Anesthesia Plan  ASA: II and emergent  Anesthesia Plan: General   Post-op Pain Management:    Induction:   PONV Risk Score and Plan: Ondansetron  Airway Management Planned:   Additional Equipment:   Intra-op Plan:   Post-operative Plan:   Informed Consent: I have reviewed the patients History and Physical, chart, labs and discussed the procedure including the risks, benefits and alternatives for the proposed anesthesia with the patient or authorized representative who has indicated his/her understanding and acceptance.     Dental Advisory Given  Plan Discussed with: CRNA  Anesthesia Plan Comments:         Anesthesia Quick Evaluation

## 2020-10-31 ENCOUNTER — Other Ambulatory Visit: Payer: Self-pay

## 2020-10-31 ENCOUNTER — Emergency Department (HOSPITAL_COMMUNITY): Payer: Medicaid Other

## 2020-10-31 ENCOUNTER — Inpatient Hospital Stay (HOSPITAL_COMMUNITY)
Admission: EM | Admit: 2020-10-31 | Discharge: 2020-11-09 | DRG: 372 | Disposition: A | Discharge status: Home / Self Care | Payer: Medicaid Other | Attending: Internal Medicine | Admitting: Internal Medicine

## 2020-10-31 ENCOUNTER — Encounter (HOSPITAL_COMMUNITY): Payer: Self-pay | Admitting: Emergency Medicine

## 2020-10-31 ENCOUNTER — Telehealth: Payer: Self-pay | Admitting: Family Medicine

## 2020-10-31 DIAGNOSIS — E7800 Pure hypercholesterolemia, unspecified: Secondary | ICD-10-CM | POA: Diagnosis present

## 2020-10-31 DIAGNOSIS — E039 Hypothyroidism, unspecified: Secondary | ICD-10-CM | POA: Diagnosis present

## 2020-10-31 DIAGNOSIS — Z6836 Body mass index (BMI) 36.0-36.9, adult: Secondary | ICD-10-CM

## 2020-10-31 DIAGNOSIS — J9811 Atelectasis: Secondary | ICD-10-CM | POA: Diagnosis present

## 2020-10-31 DIAGNOSIS — Z79899 Other long term (current) drug therapy: Secondary | ICD-10-CM

## 2020-10-31 DIAGNOSIS — E876 Hypokalemia: Secondary | ICD-10-CM

## 2020-10-31 DIAGNOSIS — F988 Other specified behavioral and emotional disorders with onset usually occurring in childhood and adolescence: Secondary | ICD-10-CM | POA: Diagnosis present

## 2020-10-31 DIAGNOSIS — K9189 Other postprocedural complications and disorders of digestive system: Secondary | ICD-10-CM

## 2020-10-31 DIAGNOSIS — K560 Paralytic ileus: Secondary | ICD-10-CM | POA: Diagnosis present

## 2020-10-31 DIAGNOSIS — I10 Essential (primary) hypertension: Secondary | ICD-10-CM | POA: Diagnosis present

## 2020-10-31 DIAGNOSIS — Z803 Family history of malignant neoplasm of breast: Secondary | ICD-10-CM

## 2020-10-31 DIAGNOSIS — E669 Obesity, unspecified: Secondary | ICD-10-CM | POA: Diagnosis present

## 2020-10-31 DIAGNOSIS — Z20822 Contact with and (suspected) exposure to covid-19: Secondary | ICD-10-CM | POA: Diagnosis present

## 2020-10-31 DIAGNOSIS — R112 Nausea with vomiting, unspecified: Secondary | ICD-10-CM

## 2020-10-31 DIAGNOSIS — F419 Anxiety disorder, unspecified: Secondary | ICD-10-CM | POA: Diagnosis present

## 2020-10-31 DIAGNOSIS — Z79890 Hormone replacement therapy (postmenopausal): Secondary | ICD-10-CM

## 2020-10-31 DIAGNOSIS — F319 Bipolar disorder, unspecified: Secondary | ICD-10-CM | POA: Diagnosis present

## 2020-10-31 DIAGNOSIS — K567 Ileus, unspecified: Secondary | ICD-10-CM | POA: Diagnosis present

## 2020-10-31 DIAGNOSIS — K651 Peritoneal abscess: Principal | ICD-10-CM | POA: Diagnosis present

## 2020-10-31 DIAGNOSIS — F17210 Nicotine dependence, cigarettes, uncomplicated: Secondary | ICD-10-CM | POA: Diagnosis present

## 2020-10-31 DIAGNOSIS — E785 Hyperlipidemia, unspecified: Secondary | ICD-10-CM | POA: Diagnosis present

## 2020-10-31 DIAGNOSIS — Z23 Encounter for immunization: Secondary | ICD-10-CM

## 2020-10-31 LAB — BASIC METABOLIC PANEL
Anion gap: 8 (ref 5–15)
BUN: 12 mg/dL (ref 6–20)
CO2: 25 mmol/L (ref 22–32)
Calcium: 7.9 mg/dL — ABNORMAL LOW (ref 8.9–10.3)
Chloride: 99 mmol/L (ref 98–111)
Creatinine, Ser: 0.91 mg/dL (ref 0.44–1.00)
GFR, Estimated: >60 mL/min (ref >60)
Glucose, Bld: 130 mg/dL — ABNORMAL HIGH (ref 70–99)
Potassium: 3.4 mmol/L — ABNORMAL LOW (ref 3.5–5.1)
Sodium: 132 mmol/L — ABNORMAL LOW (ref 135–145)

## 2020-10-31 LAB — MAGNESIUM: Magnesium: 2.3 mg/dL (ref 1.7–2.4)

## 2020-10-31 LAB — CBC
HCT: 40.1 % (ref 36.0–46.0)
HCT: 38.4 % (ref 36.0–46.0)
Hemoglobin: 13.1 g/dL (ref 12.0–15.0)
Hemoglobin: 13 g/dL (ref 12.0–15.0)
MCH: 31.3 pg (ref 26.0–34.0)
MCH: 31.3 pg (ref 26.0–34.0)
MCHC: 32.7 g/dL (ref 30.0–36.0)
MCHC: 33.9 g/dL (ref 30.0–36.0)
MCV: 95.9 fL (ref 80.0–100.0)
MCV: 92.3 fL (ref 80.0–100.0)
Platelets: 247 K/uL (ref 150–400)
Platelets: 258 K/uL (ref 150–400)
RBC: 4.18 MIL/uL (ref 3.87–5.11)
RBC: 4.16 MIL/uL (ref 3.87–5.11)
RDW: 13.2 % (ref 11.5–15.5)
RDW: 12.9 % (ref 11.5–15.5)
WBC: 9.7 K/uL (ref 4.0–10.5)
WBC: 4.3 K/uL (ref 4.0–10.5)
nRBC: 0 % (ref 0.0–0.2)
nRBC: 0 % (ref 0.0–0.2)

## 2020-10-31 LAB — PHOSPHORUS: Phosphorus: 2.1 mg/dL — ABNORMAL LOW (ref 2.5–4.6)

## 2020-10-31 MED ORDER — OXYCODONE-ACETAMINOPHEN 5-325 MG PO TABS: 1.0000 | ORAL_TABLET | ORAL | 0 refills | Status: AC | PRN

## 2020-10-31 MED ORDER — AMOXICILLIN-POT CLAVULANATE 875-125 MG PO TABS: 1.0000 | ORAL_TABLET | Freq: Two times a day (BID) | ORAL | 0 refills | Status: DC

## 2020-10-31 MED ORDER — OXYCODONE-ACETAMINOPHEN 5-325 MG PO TABS: 1.0000 | ORAL_TABLET | ORAL | 0 refills | Status: DC | PRN

## 2020-10-31 MED ORDER — ACETAMINOPHEN 500 MG PO TABS
1000.0000 mg | ORAL_TABLET | Freq: Once | ORAL | Status: AC
  Administered 2020-11-01: 1000 mg via ORAL
  Filled 2020-10-31: qty 2

## 2020-10-31 MED ORDER — POTASSIUM CHLORIDE ER 20 MEQ PO TBCR: 10.0000 meq | EXTENDED_RELEASE_TABLET | Freq: Every day | ORAL | 0 refills | Status: DC

## 2020-10-31 MED ORDER — SODIUM CHLORIDE 0.9 % IV BOLUS
1000.0000 mL | Freq: Once | INTRAVENOUS | Status: AC
  Administered 2020-10-31: 23:00:00 1000 mL via INTRAVENOUS

## 2020-10-31 NOTE — Telephone Encounter (Signed)
Pt's mother called and states that pt is unable to keep anything down and has been vomiting all day. She tried to drink some Dr. Reino Kent and it did not stay down at all. Pt has not taken anything for her nausea and vomiting. She does have Zofran at home.  Recommended that she take a Zofran, no Dr. Reino Kent, Drink Gatorade, ginger ale or Pedialyte but nothing red. If that does not help the N&V and pt continues to take her to the ER. Pt's mother verbalized understanding.

## 2020-10-31 NOTE — ED Provider Notes (Signed)
MSE was initiated and I personally evaluated the patient and placed orders (if any) at  10:57 PM on October 31, 2020.  Fever, tachycardia, but nontoxic-appearing.  Appendectomy yesterday.  The patient appears stable so that the remainder of the MSE may be completed by another provider.   Sabas Sous, MD 10/31/20 2257

## 2020-10-31 NOTE — Discharge Instructions (Signed)
Laparoscopic Appendectomy, Adult, Care After This sheet gives you information about how to care for yourself after your procedure. Your doctor may also give you more specific instructions. If you have problems or questions, contact your doctor. What can I expect after the procedure? After the procedure, it is common to have:  Little energy for normal activities.  Mild pain in the area where the cuts from surgery (incisions) were made.  Trouble pooping (constipation). This can be caused by: ? Pain medicine. ? A lack of activity. Follow these instructions at home: Medicines  Take over-the-counter and prescription medicines only as told by your doctor.  If you were prescribed an antibiotic medicine, take it as told by your doctor. Do not stop taking it even if you start to feel better.  Do not drive or use heavy machinery while taking prescription pain medicine.  Ask your doctor if the medicine you are taking can cause trouble pooping. You may need to take steps to prevent or treat trouble pooping: ? Drink enough fluid to keep your pee (urine) pale yellow. ? Take over-the-counter or prescription medicines. ? Eat foods that are high in fiber. These include beans, whole grains, and fresh fruits and vegetables. ? Limit foods that are high in fat and sugar. These include fried or sweet foods. Incision care   Follow instructions from your doctor about how to take care of your cuts from surgery. Make sure you: ? Wash your hands with soap and water before and after you change your bandage (dressing). If you cannot use soap and water, use hand sanitizer. ? Change your bandage as told by your doctor. ? Leave stitches (sutures), skin glue, or skin tape (adhesive) strips in place. They may need to stay in place for 2 weeks or longer. If tape strips get loose and curl up, you may trim the loose edges. Do not remove tape strips completely unless your doctor says it is okay.  Check your cuts from  surgery every day for signs of infection. Check for: ? Redness, swelling, or pain. ? Fluid or blood. ? Warmth. ? Pus or a bad smell. Bathing  Keep your cuts from surgery clean and dry. Clean them as told by your doctor. To do this: 1. Gently wash the cuts with soap and water. 2. Rinse the cuts with water to remove all soap. 3. Pat the cuts dry with a clean towel. Do not rub the cuts.  Do not take baths, swim, or use a hot tub for 2 weeks, or until your doctor says it is okay. You may take showers after 48 hours. Activity   Do not drive for 24 hours if you were given a medicine to help you relax (sedative) during your procedure.  Rest after the procedure. Return to your normal activities as told by your doctor. Ask your doctor what activities are safe for you.  For 3 weeks, or for as long as told by your doctor: ? Do not lift anything that is heavier than 10 lb (4.5 kg), or the limit that you are told. ? Do not play contact sports. General instructions  If you were sent home with a drain, follow instructions from your doctor on how to care for it.  Take deep breaths. This helps to keep your lungs from getting an infection (pneumonia).  Keep all follow-up visits as told by your doctor. This is important. Contact a doctor if:  You have redness, swelling, or pain around a cut from surgery.    You have fluid or blood coming from a cut.  Your cut feels warm to the touch.  You have pus or a bad smell coming from a cut or a bandage.  The edges of a cut break open after the stitches have been taken out.  You have pain in your shoulders that gets worse.  You feel dizzy or you pass out (faint).  You have shortness of breath.  You keep feeling sick to your stomach (nauseous).  You keep throwing up (vomiting).  You get watery poop (diarrhea) or you cannot control your poop.  You lose your appetite.  You have swelling or pain in your legs.  You get a rash. Get help right  away if:  You have a fever.  You have trouble breathing.  You have sharp pains in your chest. Summary  After the procedure, it is common to have low energy, mild pain, and trouble pooping.  Infection is a common problem after this procedure. Follow your doctor's instructions about caring for yourself after the procedure.  Rest after the procedure. Return to your normal activities as told by your doctor.  Contact your doctor if you see signs of infection around your cuts from surgery, or you get short of breath. Get help right away if you have a fever, chest pain, or trouble breathing. This information is not intended to replace advice given to you by your health care provider. Make sure you discuss any questions you have with your health care provider. Document Revised: 05/07/2018 Document Reviewed: 05/07/2018 Elsevier Patient Education  2020 [employer] [facility] Surgical drains are used to remove extra fluid that normally builds up in a surgical wound after surgery. A surgical drain helps to heal a surgical wound. Different kinds of surgical drains include:  Active drains. These drains use suction to pull drainage away from the surgical wound. Drainage flows through a tube to a container outside of the body. With these drains, you need to keep the bulb or the drainage container flat (compressed) at all times, except while you empty it. Flattening the bulb or container creates suction.  Passive drains. These drains allow fluid to drain naturally, by gravity. Drainage flows through a tube to a bandage (dressing) or a container outside of the body. Passive drains do not need to be emptied. A drain is placed during surgery. Right after surgery, drainage is usually bright red and a little thicker than water. The drainage may gradually turn yellow or pink and become thinner. It is likely that your health care provider will remove the drain when the drainage stops or when the  amount decreases to 1-2 Tbsp (15-30 mL) during a 24-hour period. Supplies needed:  Tape.  Germ-free cleaning solution (sterile saline).  Cotton swabs.  Split gauze drain sponge: 4 x 4 inches (10 x 10 cm).  Gauze square: 4 x 4 inches (10 x 10 cm). How to care for your surgical drain Care for your drain as told by your health care provider. This is important to help prevent infection. If your drain is placed at your back, or any other hard-to-reach area, ask another person to assist you in performing the following tasks: General care  Keep the skin around the drain dry and covered with a dressing at all times.  Check your drain area every day for signs of infection. Check for: ? Redness, swelling, or pain. ? Pus or a bad smell. ? Cloudy drainage. ? Tenderness or pressure at the  drain exit site. Changing the dressing Follow instructions from your health care provider about how to change your dressing. Change your dressing at least once a day. Change it more often if needed to keep the dressing dry. Make sure you: 1. Gather your supplies. 2. Wash your hands with soap and water before you change your dressing. If soap and water are not available, use hand sanitizer. 3. Remove the old dressing. Avoid using scissors to do that. 4. Wash your hands with soap and water again after removing the old dressing. 5. Use sterile saline to clean your skin around the drain. You may need to use a cotton swab to clean the skin. 6. Place the tube through the slit in a drain sponge. Place the drain sponge so that it covers your wound. 7. Place the gauze square or another drain sponge on top of the drain sponge that is on the wound. Make sure the tube is between those layers. 8. Tape the dressing to your skin. 9. Tape the drainage tube to your skin 1-2 inches (2.5-5 cm) below the place where the tube enters your body. Taping keeps the tube from pulling on any stitches (sutures) that you have. 10. Wash your  hands with soap and water. 11. Write down the color of your drainage and how often you change your dressing. How to empty your active drain  1. Make sure that you have a measuring cup that you can empty your drainage into. 2. Wash your hands with soap and water. If soap and water are not available, use hand sanitizer. 3. Loosen any pins or clips that hold the tube in place. 4. If your health care provider tells you to strip the tube to prevent clots and tube blockages: ? Hold the tube at the skin with one hand. Use your other hand to pinch the tubing with your thumb and first finger. ? Gently move your fingers down the tube while squeezing very lightly. This clears any drainage, clots, or tissue from the tube. ? You may need to do this several times each day to keep the tube clear. Do not pull on the tube. 5. Open the bulb cap or the drain plug. Do not touch the inside of the cap or the bottom of the plug. 6. Turn the device upside down and gently squeeze. 7. Empty all of the drainage into the measuring cup. 8. Compress the bulb or the container and replace the cap or the plug. To compress the bulb or the container, squeeze it firmly in the middle while you close the cap or plug the container. 9. Write down the amount of drainage that you have in each 24-hour period. If you have less than 2 Tbsp (30 mL) of drainage during 24 hours, contact your health care provider. 10. Flush the drainage down the toilet. 11. Wash your hands with soap and water. Contact a health care provider if:  You have redness, swelling, or pain around your drain area.  You have pus or a bad smell coming from your drain area.  You have a fever or chills.  The skin around your drain is warm to the touch.  The amount of drainage that you have is increasing instead of decreasing.  You have drainage that is cloudy.  There is a sudden stop or a sudden decrease in the amount of drainage that you have.  Your drain tube  falls out.  Your active drain does not stay compressed after you empty it.  Summary  Surgical drains are used to remove extra fluid that normally builds up in a surgical wound after surgery.  Different kinds of surgical drains include active drains and passive drains. Active drains use suction to pull drainage away from the surgical wound, and passive drains allow fluid to drain naturally.  It is important to care for your drain to prevent infection. If your drain is placed at your back, or any other hard-to-reach area, ask another person to assist you.  Contact your health care provider if you have redness, swelling, or pain around your drain area. This information is not intended to replace advice given to you by your health care provider. Make sure you discuss any questions you have with your health care provider. Document Revised: 12/09/2018 Document Reviewed: 12/09/2018 Elsevier Patient Education  2020 [employer].

## 2020-10-31 NOTE — Discharge Summary (Addendum)
Physician Discharge Summary  Patient ID: Evelyn Simpson MRN: [id number] DOB/AGE: [id number] y.o.  Admit date: 10/29/2020 Discharge date: 10/31/2020  Admission Diagnoses: Acute appendicitis  Discharge Diagnoses: Same Active Problems:   Acute appendicitis   Acute gangrenous appendicitis with perforation and peritonitis   Acute gangrenous cholecystitis Hypokalemia, hypophosphatemia  Discharged Condition: good  Hospital Course: Patient is a 44 year old white female who presented with a 2-day history of worsening right lower quadrant abdominal pain.  CT scan of the abdomen revealed acute appendicitis with surrounding enterocolitis.  She was also noted to be hypokalemic and hyponatremic.  Once her electrolytes were corrected, she underwent a laparoscopic cholecystectomy on 1213 2021.  She tolerated the surgery well.  Her postoperative course was for the most part unremarkable.  Her diet was advanced out difficulty.  A drain was left in the pelvis and right lower quadrant due to evidence of gangrene and perforation.  Her hypophosphatemia was treated.  Patient is being discharged home on postoperative day one in good and improving condition.   Treatments: surgery: Laparoscopic appendectomy on 1213 2021  Discharge Exam: Blood pressure 124/65, pulse (!) 107, temperature 98.7 F (37.1 C), temperature source Oral, resp. rate 18, height 5\' 4"  (1.626 m), weight 97.5 kg, SpO2 96 %. General appearance: alert, cooperative and no distress Resp: clear to auscultation bilaterally Cardio: regular rate and rhythm, S1, S2 normal, no murmur, click, rub or gallop GI: Soft, dressings dry and intact.  JP drainage serosanguineous in nature.  Disposition: Discharge disposition: 01-Home or Self Care       Discharge Instructions    Diet - low sodium heart healthy   Complete by: As directed    Increase activity slowly   Complete by: As directed      Allergies as of 10/31/2020   No Known Allergies      Medication List    TAKE these medications   ALPRAZolam 1 MG tablet Commonly known as: XANAX Take 1 tablet by mouth at bedtime as needed.   ALPRAZolam 1 MG 24 hr tablet Commonly known as: XANAX XR Take 1 mg by mouth every morning.   amoxicillin-clavulanate 875-125 MG tablet Commonly known as: Augmentin Take 1 tablet by mouth 2 (two) times daily.   desvenlafaxine 100 MG 24 hr tablet Commonly known as: PRISTIQ Take 100 mg by mouth 2 (two) times daily.   hydrOXYzine 25 MG capsule Commonly known as: VISTARIL Take 25 mg by mouth 3 (three) times daily as needed.   levothyroxine 112 MCG tablet Commonly known as: SYNTHROID Take 112 mcg by mouth daily before breakfast.   Linzess 290 MCG Caps capsule Generic drug: linaclotide TAKE ONE (1) CAPSULE EACH DAY What changed: See the new instructions.   lisinopril-hydrochlorothiazide 20-12.5 MG tablet Commonly known as: ZESTORETIC Take 1 tablet by mouth daily.   ondansetron 8 MG disintegrating tablet Commonly known as: ZOFRAN-ODT Take 8 mg by mouth 3 (three) times daily.   oxyCODONE-acetaminophen 5-325 MG tablet Commonly known as: Percocet Take 1 tablet by mouth every 4 (four) hours as needed for severe pain.   Potassium Chloride ER 20 MEQ Tbcr Take 10 mEq by mouth daily.   QUEtiapine 300 MG tablet Commonly known as: SEROQUEL Take 600 mg by mouth at bedtime.   simvastatin 20 MG tablet Commonly known as: ZOCOR Take 20 mg by mouth daily.   Vitamin D (Ergocalciferol) 1.25 MG (50000 UNIT) Caps capsule Commonly known as: DRISDOL Take 50,000 Units by mouth once a week. Saturdays  Follow-up Information    Lucretia Roers, MD. Schedule an appointment as soon as possible for a visit on 11/07/2020.   Specialty: General Surgery Contact information: [address] [place] [address] [phone]               Signed: Franky Macho 10/31/2020, 7:52 AM

## 2020-10-31 NOTE — ED Triage Notes (Signed)
Pt is here for post appendectomy complications. Pt states she was discharged from the hospital today, and since going home she has not been able to hold down food or liquid.

## 2020-10-31 NOTE — Progress Notes (Signed)
Discharge instructions reviewed with patient at this time. Patient given opportunity to voice concerns/ask questions. IV removed per policy and band-aid applied to site. Patient has her ride home on the way. Patient will let staff know when ride is here.

## 2020-11-01 ENCOUNTER — Emergency Department (HOSPITAL_COMMUNITY): Payer: Medicaid Other

## 2020-11-01 ENCOUNTER — Encounter (HOSPITAL_COMMUNITY): Payer: Self-pay | Admitting: General Surgery

## 2020-11-01 DIAGNOSIS — E039 Hypothyroidism, unspecified: Secondary | ICD-10-CM | POA: Diagnosis present

## 2020-11-01 DIAGNOSIS — K567 Ileus, unspecified: Secondary | ICD-10-CM | POA: Diagnosis present

## 2020-11-01 DIAGNOSIS — R112 Nausea with vomiting, unspecified: Secondary | ICD-10-CM | POA: Diagnosis not present

## 2020-11-01 DIAGNOSIS — F419 Anxiety disorder, unspecified: Secondary | ICD-10-CM | POA: Diagnosis present

## 2020-11-01 DIAGNOSIS — F319 Bipolar disorder, unspecified: Secondary | ICD-10-CM | POA: Diagnosis present

## 2020-11-01 DIAGNOSIS — K9189 Other postprocedural complications and disorders of digestive system: Secondary | ICD-10-CM | POA: Diagnosis present

## 2020-11-01 DIAGNOSIS — Z23 Encounter for immunization: Secondary | ICD-10-CM | POA: Diagnosis not present

## 2020-11-01 DIAGNOSIS — Z20822 Contact with and (suspected) exposure to covid-19: Secondary | ICD-10-CM | POA: Diagnosis present

## 2020-11-01 DIAGNOSIS — K651 Peritoneal abscess: Secondary | ICD-10-CM | POA: Diagnosis present

## 2020-11-01 DIAGNOSIS — Z803 Family history of malignant neoplasm of breast: Secondary | ICD-10-CM | POA: Diagnosis not present

## 2020-11-01 DIAGNOSIS — Z79899 Other long term (current) drug therapy: Secondary | ICD-10-CM | POA: Diagnosis not present

## 2020-11-01 DIAGNOSIS — I10 Essential (primary) hypertension: Secondary | ICD-10-CM | POA: Diagnosis present

## 2020-11-01 DIAGNOSIS — Z6836 Body mass index (BMI) 36.0-36.9, adult: Secondary | ICD-10-CM | POA: Diagnosis not present

## 2020-11-01 DIAGNOSIS — E876 Hypokalemia: Secondary | ICD-10-CM | POA: Diagnosis present

## 2020-11-01 DIAGNOSIS — E7800 Pure hypercholesterolemia, unspecified: Secondary | ICD-10-CM | POA: Diagnosis present

## 2020-11-01 DIAGNOSIS — Z79890 Hormone replacement therapy (postmenopausal): Secondary | ICD-10-CM | POA: Diagnosis not present

## 2020-11-01 DIAGNOSIS — K560 Paralytic ileus: Secondary | ICD-10-CM | POA: Diagnosis present

## 2020-11-01 DIAGNOSIS — E785 Hyperlipidemia, unspecified: Secondary | ICD-10-CM | POA: Diagnosis present

## 2020-11-01 DIAGNOSIS — E669 Obesity, unspecified: Secondary | ICD-10-CM | POA: Diagnosis present

## 2020-11-01 DIAGNOSIS — J9811 Atelectasis: Secondary | ICD-10-CM | POA: Diagnosis present

## 2020-11-01 DIAGNOSIS — F17210 Nicotine dependence, cigarettes, uncomplicated: Secondary | ICD-10-CM | POA: Diagnosis present

## 2020-11-01 DIAGNOSIS — F988 Other specified behavioral and emotional disorders with onset usually occurring in childhood and adolescence: Secondary | ICD-10-CM | POA: Diagnosis present

## 2020-11-01 LAB — URINALYSIS, ROUTINE W REFLEX MICROSCOPIC
Glucose, UA: NEGATIVE mg/dL
Ketones, ur: 20 mg/dL — AB
Leukocytes,Ua: NEGATIVE
Nitrite: NEGATIVE
Protein, ur: 100 mg/dL — AB
RBC / HPF: >50 RBC/hpf — ABNORMAL HIGH (ref 0–5)
Specific Gravity, Urine: 1.031 — ABNORMAL HIGH (ref 1.005–1.030)
pH: 5 (ref 5.0–8.0)

## 2020-11-01 LAB — COMPREHENSIVE METABOLIC PANEL
ALT: 20 U/L (ref 0–44)
AST: 33 U/L (ref 15–41)
Albumin: 3 g/dL — ABNORMAL LOW (ref 3.5–5.0)
Alkaline Phosphatase: 74 U/L (ref 38–126)
Anion gap: 12 (ref 5–15)
BUN: 12 mg/dL (ref 6–20)
CO2: 24 mmol/L (ref 22–32)
Calcium: 8.1 mg/dL — ABNORMAL LOW (ref 8.9–10.3)
Chloride: 94 mmol/L — ABNORMAL LOW (ref 98–111)
Creatinine, Ser: 0.8 mg/dL (ref 0.44–1.00)
GFR, Estimated: >60 mL/min (ref >60)
Glucose, Bld: 151 mg/dL — ABNORMAL HIGH (ref 70–99)
Potassium: 4 mmol/L (ref 3.5–5.1)
Sodium: 130 mmol/L — ABNORMAL LOW (ref 135–145)
Total Bilirubin: 2.3 mg/dL — ABNORMAL HIGH (ref 0.3–1.2)
Total Protein: 6.9 g/dL (ref 6.5–8.1)

## 2020-11-01 LAB — SURGICAL PATHOLOGY

## 2020-11-01 LAB — HCG, QUANTITATIVE, PREGNANCY: hCG, Beta Chain, Quant, S: <1 m[IU]/mL (ref <5)

## 2020-11-01 LAB — RESP PANEL BY RT-PCR (FLU A&B, COVID) ARPGX2
Influenza A by PCR: NEGATIVE
Influenza B by PCR: NEGATIVE
SARS Coronavirus 2 by RT PCR: NEGATIVE

## 2020-11-01 LAB — LACTIC ACID, PLASMA: Lactic Acid, Venous: 1.5 mmol/L (ref 0.5–1.9)

## 2020-11-01 MED ORDER — ONDANSETRON HCL 4 MG/2ML IJ SOLN
4.0000 mg | Freq: Four times a day (QID) | INTRAMUSCULAR | Status: DC | PRN
  Administered 2020-11-01 – 2020-11-07 (×3): 4 mg via INTRAVENOUS
  Filled 2020-11-01 (×3): qty 2

## 2020-11-01 MED ORDER — MORPHINE SULFATE (PF) 2 MG/ML IV SOLN: 2.0000 mg | INTRAVENOUS | Status: DC | PRN

## 2020-11-01 MED ORDER — PROCHLORPERAZINE EDISYLATE 10 MG/2ML IJ SOLN: 5.0000 mg | Freq: Four times a day (QID) | INTRAMUSCULAR | Status: DC | PRN

## 2020-11-01 MED ORDER — ONDANSETRON 4 MG PO TBDP: 4.0000 mg | ORAL_TABLET | Freq: Four times a day (QID) | ORAL | Status: DC | PRN

## 2020-11-01 MED ORDER — VENLAFAXINE HCL ER 150 MG PO CP24
150.0000 mg | ORAL_CAPSULE | Freq: Every day | ORAL | Status: DC
  Administered 2020-11-02 – 2020-11-09 (×8): 150 mg via ORAL
  Filled 2020-11-01 (×2): qty 1
  Filled 2020-11-01: qty 2
  Filled 2020-11-01: qty 1
  Filled 2020-11-01 (×2): qty 2
  Filled 2020-11-01: qty 1
  Filled 2020-11-01: qty 2

## 2020-11-01 MED ORDER — PANTOPRAZOLE SODIUM 40 MG IV SOLR
40.0000 mg | Freq: Every day | INTRAVENOUS | Status: DC
  Administered 2020-11-01 – 2020-11-07 (×7): 40 mg via INTRAVENOUS
  Filled 2020-11-01 (×7): qty 40

## 2020-11-01 MED ORDER — QUETIAPINE FUMARATE 100 MG PO TABS
600.0000 mg | ORAL_TABLET | Freq: Every day | ORAL | Status: DC
  Administered 2020-11-01 – 2020-11-08 (×8): 600 mg via ORAL
  Filled 2020-11-01 (×8): qty 6

## 2020-11-01 MED ORDER — PROCHLORPERAZINE MALEATE 10 MG PO TABS
10.0000 mg | ORAL_TABLET | Freq: Four times a day (QID) | ORAL | Status: DC | PRN
  Filled 2020-11-01: qty 1

## 2020-11-01 MED ORDER — LEVOTHYROXINE SODIUM 112 MCG PO TABS
112.0000 ug | ORAL_TABLET | Freq: Every day | ORAL | Status: DC
  Administered 2020-11-02 – 2020-11-09 (×8): 112 ug via ORAL
  Filled 2020-11-01 (×9): qty 1

## 2020-11-01 MED ORDER — SODIUM CHLORIDE 0.9 % IV SOLN
2.0000 g | Freq: Two times a day (BID) | INTRAVENOUS | Status: DC
  Administered 2020-11-01 – 2020-11-05 (×9): 2 g via INTRAVENOUS
  Filled 2020-11-01 (×10): qty 2

## 2020-11-01 MED ORDER — ENOXAPARIN SODIUM 40 MG/0.4ML ~~LOC~~ SOLN
40.0000 mg | SUBCUTANEOUS | Status: DC
  Administered 2020-11-01 – 2020-11-05 (×5): 40 mg via SUBCUTANEOUS
  Filled 2020-11-01 (×5): qty 0.4

## 2020-11-01 MED ORDER — KETOROLAC TROMETHAMINE 30 MG/ML IJ SOLN
30.0000 mg | Freq: Four times a day (QID) | INTRAMUSCULAR | Status: DC | PRN
  Administered 2020-11-01: 30 mg via INTRAVENOUS
  Filled 2020-11-01: qty 1

## 2020-11-01 MED ORDER — ONDANSETRON HCL 4 MG/2ML IJ SOLN
4.0000 mg | Freq: Once | INTRAMUSCULAR | Status: AC
  Administered 2020-11-01: 01:00:00 4 mg via INTRAVENOUS
  Filled 2020-11-01: qty 2

## 2020-11-01 MED ORDER — DIPHENHYDRAMINE HCL 50 MG/ML IJ SOLN: 12.5000 mg | Freq: Four times a day (QID) | INTRAMUSCULAR | Status: DC | PRN

## 2020-11-01 MED ORDER — METOPROLOL TARTRATE 5 MG/5ML IV SOLN: 5.0000 mg | Freq: Four times a day (QID) | INTRAVENOUS | Status: DC | PRN

## 2020-11-01 MED ORDER — SODIUM CHLORIDE 0.9 % IV BOLUS
1000.0000 mL | Freq: Once | INTRAVENOUS | Status: AC
  Administered 2020-11-01: 01:00:00 1000 mL via INTRAVENOUS

## 2020-11-01 MED ORDER — SIMETHICONE 80 MG PO CHEW: 40.0000 mg | CHEWABLE_TABLET | Freq: Four times a day (QID) | ORAL | Status: DC | PRN

## 2020-11-01 MED ORDER — LACTATED RINGERS IV SOLN: INTRAVENOUS | Status: DC

## 2020-11-01 MED ORDER — LORAZEPAM 2 MG/ML IJ SOLN
0.5000 mg | Freq: Four times a day (QID) | INTRAMUSCULAR | Status: DC | PRN
  Administered 2020-11-01 – 2020-11-03 (×3): 0.5 mg via INTRAVENOUS
  Filled 2020-11-01 (×3): qty 1

## 2020-11-01 MED ORDER — BISACODYL 10 MG RE SUPP
10.0000 mg | Freq: Every day | RECTAL | Status: DC
  Administered 2020-11-01 – 2020-11-08 (×4): 10 mg via RECTAL
  Filled 2020-11-01 (×10): qty 1

## 2020-11-01 MED ORDER — DIPHENHYDRAMINE HCL 12.5 MG/5ML PO ELIX: 12.5000 mg | ORAL_SOLUTION | Freq: Four times a day (QID) | ORAL | Status: DC | PRN

## 2020-11-01 NOTE — ED Notes (Signed)
Pt ambulated to the restroom, states she forgot to get a urine sample and will get it at a later time.

## 2020-11-01 NOTE — ED Notes (Signed)
Attempted to give pt. Suppository. Pt. Stated " Not right now, maybe after I take a nap."

## 2020-11-01 NOTE — H&P (Addendum)
[facility] History and Physical  Reason for Referral: Post operative ileus  Referring Physician:  Dr. Lynelle Doctor  Chief Complaint    Abdominal Pain      Evelyn Simpson is a 44 y.o. female.  HPI: Evelyn Simpson is a 44 yo who had a laparoscopic appendectomy with drain placement for perforated appendicitis on 12/13 and was d/c home on 12/14. She returned to the ED that evening with nausea and vomiting. She vomited multiple times and was unable to keep anything down. She had a Xray that demonstrated post operative ileus. She continues to have vomiting. She had NG attempted X 4 and is willing to try again.  She denies any flatus or BM.   Past Medical History:  Diagnosis Date  . ADD (attention deficit disorder)   . Anxiety   . Bipolar 1 disorder (HCC)   . Depression   . HTN (hypertension)   . Hypercholesteremia   . Hypothyroidism     Past Surgical History:  Procedure Laterality Date  . APPENDECTOMY    . CERVIX SURGERY    . COLONOSCOPY WITH PROPOFOL N/A 05/10/2019   Procedure: COLONOSCOPY WITH PROPOFOL;  Surgeon: Corbin Ade, MD;  Location: [facility];  Service: Endoscopy;  Laterality: N/A;  2:30pm - pt does not want to move up  . HAND EXPLORATION Left    removal of glass from finger  . MANDIBLE SURGERY      Family History  Problem Relation Age of Onset  . Breast cancer Mother   . Liver disease Neg Hx   . Colon cancer Neg Hx     Social History   Tobacco Use  . Smoking status: Current Every Day Smoker    Packs/day: 1.00    Years: 28.00    Pack years: 28.00    Types: Cigarettes  . Smokeless tobacco: Never Used  Vaping Use  . Vaping Use: Never used  Substance Use Topics  . Alcohol use: Yes  . Drug use: Not Currently    Medications:  I have reviewed the patient's current medications. Current Facility-Administered Medications  Medication Dose Route Frequency Provider Last Rate Last Admin  . cefoTEtan (CEFOTAN) 1 g in sodium chloride 0.9 % 100 mL  IVPB  1 g Intravenous Q12H Lucretia Roers, MD      . diphenhydrAMINE (BENADRYL) 12.5 MG/5ML elixir 12.5 mg  12.5 mg Oral Q6H PRN Lucretia Roers, MD       Or  . diphenhydrAMINE (BENADRYL) injection 12.5 mg  12.5 mg Intravenous Q6H PRN Lucretia Roers, MD      . enoxaparin (LOVENOX) injection 40 mg  40 mg Subcutaneous Q24H Lucretia Roers, MD   40 mg at 11/01/20 0547  . ketorolac (TORADOL) 30 MG/ML injection 30 mg  30 mg Intravenous Q6H PRN Lucretia Roers, MD      . lactated ringers infusion   Intravenous Continuous Lucretia Roers, MD 75 mL/hr at 11/01/20 0408 New Bag at 11/01/20 0408  . [START ON 11/02/2020] levothyroxine (SYNTHROID) tablet 112 mcg  112 mcg Oral QAC breakfast Lucretia Roers, MD      . LORazepam (ATIVAN) injection 0.5 mg  0.5 mg Intravenous Q6H PRN Lucretia Roers, MD      . metoprolol tartrate (LOPRESSOR) injection 5 mg  5 mg Intravenous Q6H PRN Lucretia Roers, MD      . morphine 2 MG/ML injection 2 mg  2 mg Intravenous Q3H PRN Lucretia Roers, MD      .  ondansetron (ZOFRAN-ODT) disintegrating tablet 4 mg  4 mg Oral Q6H PRN Lucretia Roers, MD       Or  . ondansetron [facility]) injection 4 mg  4 mg Intravenous Q6H PRN Lucretia Roers, MD      . pantoprazole (PROTONIX) injection 40 mg  40 mg Intravenous QHS Lucretia Roers, MD      . prochlorperazine (COMPAZINE) tablet 10 mg  10 mg Oral Q6H PRN Lucretia Roers, MD       Or  . prochlorperazine (COMPAZINE) injection 5-10 mg  5-10 mg Intravenous Q6H PRN Lucretia Roers, MD      . QUEtiapine (SEROQUEL) tablet 600 mg  600 mg Oral QHS Lucretia Roers, MD      . simethicone [facility]) chewable tablet 40 mg  40 mg Oral Q6H PRN Lucretia Roers, MD      . Melene Muller ON 11/02/2020] venlafaxine XR (EFFEXOR-XR) 24 hr capsule 150 mg  150 mg Oral Q breakfast Lucretia Roers, MD       Current Outpatient Medications  Medication Sig Dispense Refill Last Dose  . ALPRAZolam (XANAX XR) 1 MG 24 hr  tablet Take 1 mg by mouth every morning.    10/31/2020 at Unknown time  . ALPRAZolam (XANAX) 1 MG tablet Take 1 tablet by mouth at bedtime as needed.   10/31/2020 at Unknown time  . desvenlafaxine (PRISTIQ) 100 MG 24 hr tablet Take 100 mg by mouth 2 (two) times daily.    10/31/2020 at Unknown time  . hydrOXYzine (VISTARIL) 25 MG capsule Take 25 mg by mouth 3 (three) times daily as needed.     Marland Kitchen levothyroxine (SYNTHROID) 112 MCG tablet Take 112 mcg by mouth daily before breakfast.    10/31/2020 at Unknown time  . LINZESS 290 MCG CAPS capsule TAKE ONE (1) CAPSULE EACH DAY (Patient taking differently: Take 290 mcg by mouth daily before breakfast.) 90 capsule 3 10/31/2020 at Unknown time  . lisinopril-hydrochlorothiazide (ZESTORETIC) 20-12.5 MG tablet Take 1 tablet by mouth daily.   10/31/2020 at Unknown time  . ondansetron (ZOFRAN-ODT) 8 MG disintegrating tablet Take 8 mg by mouth 3 (three) times daily.   Past Week at Unknown time  . oxyCODONE-acetaminophen (PERCOCET) 5-325 MG tablet Take 1 tablet by mouth every 4 (four) hours as needed for severe pain. 30 tablet 0   . QUEtiapine (SEROQUEL) 300 MG tablet Take 600 mg by mouth at bedtime.    10/31/2020 at Unknown time  . simvastatin (ZOCOR) 20 MG tablet Take 20 mg by mouth daily.    10/31/2020 at Unknown time  . Vitamin D, Ergocalciferol, (DRISDOL) 1.25 MG (50000 UT) CAPS capsule Take 50,000 Units by mouth once a week. Saturdays   Past Week at Unknown time  . amoxicillin-clavulanate (AUGMENTIN) 875-125 MG tablet Take 1 tablet by mouth 2 (two) times daily. 10 tablet 0   . potassium chloride 20 MEQ TBCR Take 10 mEq by mouth daily. 14 tablet 0    No Known Allergies    ROS:  A comprehensive review of systems was negative except for: Gastrointestinal: positive for abdominal pain, constipation, nausea and vomiting  Blood pressure (!) 110/47, pulse 100, temperature 99.2 F (37.3 C), temperature source Oral, resp. rate (!) 29, height 5\' 4"  (1.626 m),  weight 97.5 kg, last menstrual period 10/28/2020, SpO2 91 %. Physical Exam Vitals reviewed.  HENT:     Head: Normocephalic.  Cardiovascular:     Rate and Rhythm: Normal rate.  Pulmonary:  Effort: Pulmonary effort is normal.  Abdominal:     General: There is no distension.     Palpations: Abdomen is soft.     Tenderness: There is abdominal tenderness.     Comments: Port sites c/d/i with bandage, drain in LLQ with SS fluid  Skin:    General: Skin is warm.  Neurological:     General: No focal deficit present.     Mental Status: She is alert and oriented to person, place, and time.  Psychiatric:        Mood and Affect: Mood normal.        Behavior: Behavior normal.     Results: Results for orders placed or performed during the hospital encounter of 10/31/20 (from the past 48 hour(s))  Lactic acid, plasma     Status: None   Collection Time: 10/31/20 11:04 PM  Result Value Ref Range   Lactic Acid, Venous 1.5 0.5 - 1.9 mmol/L    Comment: Performed at [facility], [address], [place], [place] [address]  CBC     Status: None   Collection Time: 10/31/20 11:17 PM  Result Value Ref Range   WBC 4.3 4.0 - 10.5 K/uL   RBC 4.16 3.87 - 5.11 MIL/uL   Hemoglobin 13.0 12.0 - 15.0 g/dL   HCT 64.4 03.4 - 74.2 %   MCV 92.3 80.0 - 100.0 fL   MCH 31.3 26.0 - 34.0 pg   MCHC 33.9 30.0 - 36.0 g/dL   RDW 59.5 63.8 - 75.6 %   Platelets 258 150 - 400 K/uL   nRBC 0.0 0.0 - 0.2 %    Comment: Performed at [facility], [address] [place], [place] [address]  Comprehensive metabolic panel     Status: Abnormal   Collection Time: 10/31/20 11:17 PM  Result Value Ref Range   Sodium 130 (L) 135 - 145 mmol/L   Potassium 4.0 3.5 - 5.1 mmol/L    Comment: MODERATE HEMOLYSIS   Chloride 94 (L) 98 - 111 mmol/L   CO2 24 22 - 32 mmol/L   Glucose, Bld 151 (H) 70 - 99 mg/dL    Comment: Glucose reference range applies only to samples taken after fasting for at least 8 hours.   BUN 12 6 - 20 mg/dL    Creatinine, Ser 5.18 0.44 - 1.00 mg/dL   Calcium 8.1 (L) 8.9 - 10.3 mg/dL   Total Protein 6.9 6.5 - 8.1 g/dL   Albumin 3.0 (L) 3.5 - 5.0 g/dL   AST 33 15 - 41 U/L   ALT 20 0 - 44 U/L   Alkaline Phosphatase 74 38 - 126 U/L   Total Bilirubin 2.3 (H) 0.3 - 1.2 mg/dL   GFR, Estimated >84 >16 mL/min    Comment: (NOTE) Calculated using the CKD-EPI Creatinine Equation (2021)    Anion gap 12 5 - 15    Comment: Performed at [facility], [address], [place], [place] [address]  Resp Panel by RT-PCR (Flu A&B, Covid) Nasopharyngeal Swab     Status: None   Collection Time: 11/01/20  3:02 AM   Specimen: Nasopharyngeal Swab; Nasopharyngeal(NP) swabs in vial transport medium  Result Value Ref Range   SARS Coronavirus 2 by RT PCR NEGATIVE NEGATIVE    Comment: (NOTE) SARS-CoV-2 target nucleic acids are NOT DETECTED.  The SARS-CoV-2 RNA is generally detectable in upper respiratory specimens during the acute phase of infection. The lowest concentration of SARS-CoV-2 viral copies this assay can detect is 138 copies/mL.  A negative result does not preclude SARS-Cov-2 infection and should not be used as the sole basis for treatment or other patient management decisions. A negative result may occur with  improper specimen collection/handling, submission of specimen other than nasopharyngeal swab, presence of viral mutation(s) within the areas targeted by this assay, and inadequate number of viral copies(<138 copies/mL). A negative result must be combined with clinical observations, patient history, and epidemiological information. The expected result is Negative.  Fact Sheet for Patients:  BloggerCourse.comhttps://www.fda.gov/media/152166/download  Fact Sheet for Healthcare Providers:  SeriousBroker.ithttps://www.fda.gov/media/152162/download  This test is no t yet approved or cleared by the [place] FDA and  has been authorized for detection and/or diagnosis of SARS-CoV-2 by FDA under an Emergency Use Authorization (EUA).  This EUA will remain  in effect (meaning this test can be used) for the duration of the COVID-19 declaration under Section 564(b)(1) of the Act, 21 U.S.C.section 360bbb-3(b)(1), unless the authorization is terminated  or revoked sooner.       Influenza A by PCR NEGATIVE NEGATIVE   Influenza B by PCR NEGATIVE NEGATIVE    Comment: (NOTE) The Xpert Xpress SARS-CoV-2/FLU/RSV plus assay is intended as an aid in the diagnosis of influenza from Nasopharyngeal swab specimens and should not be used as a sole basis for treatment. Nasal washings and aspirates are unacceptable for Xpert Xpress SARS-CoV-2/FLU/RSV testing.  Fact Sheet for Patients: BloggerCourse.comhttps://www.fda.gov/media/152166/download  Fact Sheet for Healthcare Providers: SeriousBroker.ithttps://www.fda.gov/media/152162/download  This test is not yet approved or cleared by the [place] FDA and has been authorized for detection and/or diagnosis of SARS-CoV-2 by FDA under an Emergency Use Authorization (EUA). This EUA will remain in effect (meaning this test can be used) for the duration of the COVID-19 declaration under Section 564(b)(1) of the Act, 21 U.S.C. section 360bbb-3(b)(1), unless the authorization is terminated or revoked.  Performed at [facility], [address] [place], [place] [address]   hCG, quantitative, pregnancy     Status: None   Collection Time: 11/01/20  3:16 AM  Result Value Ref Range   hCG, Beta Chain, Quant, S <1 <5 mIU/mL    Comment:          GEST. AGE      CONC.  (mIU/mL)   <=1 WEEK        5 - 50     2 WEEKS       50 - 500     3 WEEKS       100 - 10,000     4 WEEKS     1,000 - 30,000     5 WEEKS     3,500 - 115,000   6-8 WEEKS     12,000 - 270,000    12 WEEKS     15,000 - 220,000        FEMALE AND NON-PREGNANT FEMALE:     LESS THAN 5 mIU/mL Performed at [facility], [address] [place], [place] [address]     DG Chest Port 1 View  Result Date: 10/31/2020 CLINICAL DATA:  Fever. Post recent appendectomy.  EXAM: PORTABLE CHEST 1 VIEW COMPARISON:  None. FINDINGS: Lung volumes are low. Patchy bibasilar opacities favoring atelectasis. There may be a small left pleural effusion. No pneumothorax. The heart is normal in size with normal mediastinal contours. No acute osseous abnormalities are seen. IMPRESSION: 1. Low lung volumes with patchy bibasilar opacities, favoring atelectasis. 2. Possible small left pleural effusion. Electronically Signed   By: Narda RutherfordMelanie  Sanford M.D.   On:  10/31/2020 23:13   DG Abd Portable 2 Views  Result Date: 11/01/2020 CLINICAL DATA:  Vomiting following appendectomy EXAM: PORTABLE ABDOMEN - 2 VIEW COMPARISON:  None. FINDINGS: Two view radiograph of the abdomen demonstrates multiple gas-filled mildly dilated loops of small bowel within the mid abdomen demonstrating air-fluid levels at similar levels on upright examination most in keeping with a postoperative adynamic ileus. No free intraperitoneal gas. Surgical drainage catheter noted within the right hemipelvis. Intrauterine device in place. Mild lumbar levoscoliosis again noted IMPRESSION: Multiple gas-filled dilated loops of bowel within the mid abdomen most in keeping with an ileus. Electronically Signed   By: Helyn Numbers MD   On: 11/01/2020 02:14     Assessment & Plan:  Evelyn Simpson is a 43 y.o. female with a post operative ileus. NG attempts not successful yet. Will try and place again later today.   -PRN for pain -OOB -NPO, can have sips with her home psych meds -IVF -Sent home on augmentin due to perforation, ordered cefotetan q12 for 5 days for now  -JP in place, SS  -SCDs, lovenox sq  -Labs tomorrow    All questions were answered to the satisfaction of the patient.   Lucretia Roers 11/01/2020, 11:20 AM

## 2020-11-01 NOTE — Progress Notes (Signed)
[facility]  Will see later in AM. Readmit for ileus post op related to perforated appendicitis.  NPO, NG IVF  PRN Pain COVID repeat ordered was negative prior   Algis Greenhouse, MD [facility] [address] [place], [place] [address] [phone] (office)

## 2020-11-01 NOTE — Progress Notes (Signed)
[facility]  NG attempted and unsuccessful. Ileus and patient understands. May try again tomorrow she reported. Will do suppository now.  Algis Greenhouse, MD [facility] [address] [place], [place] [address] [phone] (office)

## 2020-11-01 NOTE — ED Notes (Addendum)
Pt ambulated to restroom, was given urine specimen cup. Pt states "she forgot to pee in the cup again".  Dr. Lynelle Doctor made aware.

## 2020-11-01 NOTE — ED Notes (Signed)
Patient found sitting in chair in room with IV dislodged.  Patient placed back in bed and cleaned up from blood from IV.

## 2020-11-01 NOTE — ED Notes (Signed)
Dr. Henreitta Leber attempted to place an NG tube in the pt. Pt. Did not tolerate well during right side placement. Dr. Henreitta Leber asked if they could try the left side. Pt. Stated " Not right now, maybe we can try tomorrow."

## 2020-11-01 NOTE — ED Provider Notes (Signed)
[facility] EMERGENCY DEPARTMENT Provider Note   CSN: [id number] Arrival date & time: 10/31/20  2135   Time seen 11:20 PM  History Chief Complaint  Patient presents with  . Abdominal Pain    Evelyn Simpson is a 44 y.o. female.  HPI   Patient was admitted to the hospital on December 12 with acute appendicitis and had surgery on December 13 where she was found to have a gangrenous appendicitis with perforation.  She has a Jackson-Pratt drain and she was discharged from the hospital on December 14.  She states almost as soon as she was discharged she started having nausea and vomiting.  She states she has vomited at least 15 times.  She denies any increasing abdominal pain.  She denies fever.  She has had normal urination.  She notes there is only a small amount of bloody drainage from her incision site.  PCP Ihor AustinHemberg, Ruby ColaKatherine V, NP Surgery Dr Lovell SheehanJenkins  Past Medical History:  Diagnosis Date  . ADD (attention deficit disorder)   . Anxiety   . Bipolar 1 disorder (HCC)   . Depression   . HTN (hypertension)   . Hypercholesteremia   . Hypothyroidism     Patient Active Problem List   Diagnosis Date Noted  . Acute gangrenous cholecystitis 10/30/2020  . Acute gangrenous appendicitis with perforation and peritonitis   . Acute appendicitis 10/29/2020  . Abnormal LFTs 03/22/2019  . Constipation 03/22/2019  . Rectal bleeding 03/22/2019    Past Surgical History:  Procedure Laterality Date  . APPENDECTOMY    . CERVIX SURGERY    . COLONOSCOPY WITH PROPOFOL N/A 05/10/2019   Procedure: COLONOSCOPY WITH PROPOFOL;  Surgeon: Corbin Adeourk, Robert M, MD;  Location: [facility] ENDO SUITE;  Service: Endoscopy;  Laterality: N/A;  2:30pm - pt does not want to move up  . HAND EXPLORATION Left    removal of glass from finger  . MANDIBLE SURGERY       OB History   No obstetric history on file.     Family History  Problem Relation Age of Onset  . Breast cancer Mother   . Liver disease Neg Hx   . Colon  cancer Neg Hx     Social History   Tobacco Use  . Smoking status: Current Every Day Smoker    Packs/day: 1.00    Years: 28.00    Pack years: 28.00    Types: Cigarettes  . Smokeless tobacco: Never Used  Vaping Use  . Vaping Use: Never used  Substance Use Topics  . Alcohol use: Yes  . Drug use: Not Currently    Home Medications Prior to Admission medications   Medication Sig Start Date End Date Taking? Authorizing Provider  ALPRAZolam (XANAX XR) 1 MG 24 hr tablet Take 1 mg by mouth every morning.     [provider]  ALPRAZolam Prudy Feeler(XANAX) 1 MG tablet Take 1 tablet by mouth at bedtime as needed.    [provider]  amoxicillin-clavulanate (AUGMENTIN) 875-125 MG tablet Take 1 tablet by mouth 2 (two) times daily. 10/31/20   Franky MachoJenkins, Mark, MD  desvenlafaxine (PRISTIQ) 100 MG 24 hr tablet Take 100 mg by mouth 2 (two) times daily.     [provider]  hydrOXYzine (VISTARIL) 25 MG capsule Take 25 mg by mouth 3 (three) times daily as needed.    [provider]  levothyroxine (SYNTHROID) 112 MCG tablet Take 112 mcg by mouth daily before breakfast.  01/19/19   [provider]  Karlene EinsteinLINZESS  290 MCG CAPS capsule TAKE ONE (1) CAPSULE EACH DAY Patient taking differently: Take 290 mcg by mouth daily before breakfast. 10/08/19   Gelene Mink, NP  lisinopril-hydrochlorothiazide (ZESTORETIC) 20-12.5 MG tablet Take 1 tablet by mouth daily. 10/01/17   [provider]  ondansetron (ZOFRAN-ODT) 8 MG disintegrating tablet Take 8 mg by mouth 3 (three) times daily. 10/28/20   [provider]  oxyCODONE-acetaminophen (PERCOCET) 5-325 MG tablet Take 1 tablet by mouth every 4 (four) hours as needed for severe pain. 10/31/20 10/31/21  Franky Macho, MD  potassium chloride 20 MEQ TBCR Take 10 mEq by mouth daily. 10/31/20   Franky Macho, MD  QUEtiapine (SEROQUEL) 300 MG tablet Take 600 mg by mouth at bedtime.     [provider]  simvastatin (ZOCOR)  20 MG tablet Take 20 mg by mouth daily.  10/01/17   [provider]  Vitamin D, Ergocalciferol, (DRISDOL) 1.25 MG (50000 UT) CAPS capsule Take 50,000 Units by mouth once a week. Saturdays 03/13/18   [provider]    Allergies    Patient has no known allergies.  Review of Systems   Review of Systems  All other systems reviewed and are negative.   Physical Exam Updated Vital Signs BP 123/69   Pulse (!) 117   Temp (!) 100.5 F (38.1 C) (Oral)   Resp (!) 39   Ht 5\' 4"  (1.626 m)   Wt 97.5 kg   LMP 10/28/2020 Comment: Pregnancy Test Neg on 10/29/20  SpO2 94%   BMI 36.90 kg/m   Physical Exam Vitals and nursing note reviewed.  Constitutional:      Appearance: Normal appearance. She is obese.  HENT:     Head: Normocephalic and atraumatic.     Right Ear: External ear normal.     Left Ear: External ear normal.     Mouth/Throat:     Mouth: Mucous membranes are dry.  Eyes:     Extraocular Movements: Extraocular movements intact.     Conjunctiva/sclera: Conjunctivae normal.     Pupils: Pupils are equal, round, and reactive to light.  Cardiovascular:     Rate and Rhythm: Regular rhythm. Tachycardia present.     Pulses: Normal pulses.     Heart sounds: Normal heart sounds. No murmur heard.   Pulmonary:     Effort: Pulmonary effort is normal. No respiratory distress.     Breath sounds: Normal breath sounds.  Abdominal:     General: Bowel sounds are decreased. There is distension.     Comments: Mild diffuse tenderness.  There is a small amount of blood in her Jackson-Pratt drain  Musculoskeletal:        General: Normal range of motion.     Cervical back: Normal range of motion.  Skin:    General: Skin is warm and dry.  Neurological:     General: No focal deficit present.     Mental Status: She is alert and oriented to person, place, and time.     Cranial Nerves: No cranial nerve deficit.  Psychiatric:        Mood and Affect: Affect is flat.         Speech: Speech normal.        Behavior: Behavior normal. Behavior is cooperative.     ED Results / Procedures / Treatments   Labs (all labs ordered are listed, but only abnormal results are displayed) Results for orders placed or performed during the hospital encounter of 10/31/20  CBC  Result Value Ref Range   WBC 4.3 4.0 - 10.5 K/uL   RBC 4.16 3.87 - 5.11 MIL/uL   Hemoglobin 13.0 12.0 - 15.0 g/dL   HCT 05.3 97.6 - 73.4 %   MCV 92.3 80.0 - 100.0 fL   MCH 31.3 26.0 - 34.0 pg   MCHC 33.9 30.0 - 36.0 g/dL   RDW 19.3 79.0 - 24.0 %   Platelets 258 150 - 400 K/uL   nRBC 0.0 0.0 - 0.2 %  Comprehensive metabolic panel  Result Value Ref Range   Sodium 130 (L) 135 - 145 mmol/L   Potassium 4.0 3.5 - 5.1 mmol/L   Chloride 94 (L) 98 - 111 mmol/L   CO2 24 22 - 32 mmol/L   Glucose, Bld 151 (H) 70 - 99 mg/dL   BUN 12 6 - 20 mg/dL   Creatinine, Ser 9.73 0.44 - 1.00 mg/dL   Calcium 8.1 (L) 8.9 - 10.3 mg/dL   Total Protein 6.9 6.5 - 8.1 g/dL   Albumin 3.0 (L) 3.5 - 5.0 g/dL   AST 33 15 - 41 U/L   ALT 20 0 - 44 U/L   Alkaline Phosphatase 74 38 - 126 U/L   Total Bilirubin 2.3 (H) 0.3 - 1.2 mg/dL   GFR, Estimated >53 >29 mL/min   Anion gap 12 5 - 15  Lactic acid, plasma  Result Value Ref Range   Lactic Acid, Venous 1.5 0.5 - 1.9 mmol/L    Laboratory interpretation all normal except hyponatremia, hyperglycemia, elevation of total bilirubin    EKG EKG Interpretation  Date/Time:  Tuesday October 31 2020 23:16:44 EST Ventricular Rate:  114 PR Interval:    QRS Duration: 111 QT Interval:  324 QTC Calculation: 447 R Axis:   -3 Text Interpretation: Sinus tachycardia Low voltage, precordial leads No old tracing to compare Confirmed by Devoria Albe ([id number]) on 11/01/2020 12:47:00 AM   Radiology DG Chest Port 1 View  Result Date: 10/31/2020 CLINICAL DATA:  Fever. Post recent appendectomy. EXAM: PORTABLE CHEST 1 VIEW COMPARISON:  None. FINDINGS: Lung volumes are low. Patchy bibasilar  opacities favoring atelectasis. There may be a small left pleural effusion. No pneumothorax. The heart is normal in size with normal mediastinal contours. No acute osseous abnormalities are seen. IMPRESSION: 1. Low lung volumes with patchy bibasilar opacities, favoring atelectasis. 2. Possible small left pleural effusion. Electronically Signed   By: Narda Rutherford M.D.   On: 10/31/2020 23:13   DG Abd Portable 2 Views  Result Date: 11/01/2020 CLINICAL DATA:  Vomiting following appendectomy EXAM: PORTABLE ABDOMEN - 2 VIEW COMPARISON:  None. FINDINGS: Two view radiograph of the abdomen demonstrates multiple gas-filled mildly dilated loops of small bowel within the mid abdomen demonstrating air-fluid levels at similar levels on upright examination most in keeping with a postoperative adynamic ileus. No free intraperitoneal gas. Surgical drainage catheter noted within the right hemipelvis. Intrauterine device in place. Mild lumbar levoscoliosis again noted IMPRESSION: Multiple gas-filled dilated loops of bowel within the mid abdomen most in keeping with an ileus. Electronically Signed   By: Helyn Numbers MD   On: 11/01/2020 02:14   CT Abdomen Pelvis W Contrast  Result Date: 10/29/2020 CLINICAL DATA:  Acute abdominal pain with nausea  IMPRESSION: Acute appendicitis with associated appendicoliths. Diffuse mid and distal small bowel and cecal wall thickening with mucosal enhancement as well as central mesentery edema and scattered areas of mesenteric interloop free fluid. Appearance is compatible with associated enterocolitis/peritonitis. No discernible abscess, free air or  evidence of perforation. These results were called by telephone at the time of interpretation on 10/29/2020 at 2:27 pm to provider [facility] , who verbally acknowledged these results. Electronically Signed   By: Judie Petit.  Shick M.D.   On: 10/29/2020 14:29  Procedures Procedures (including critical care time)  Medications Ordered in  ED Medications  sodium chloride 0.9 % bolus 1,000 mL (0 mLs Intravenous Stopped 11/01/20 0127)  acetaminophen (TYLENOL) tablet 1,000 mg (1,000 mg Oral Given 11/01/20 0003)  sodium chloride 0.9 % bolus 1,000 mL (1,000 mLs Intravenous New Bag/Given 11/01/20 0126)  ondansetron (ZOFRAN) injection 4 mg (4 mg Intravenous Given 11/01/20 0126)    ED Course  I have reviewed the triage vital signs and the nursing notes.  Pertinent labs & imaging results that were available during my care of the patient were reviewed by me and considered in my medical decision making (see chart for details).    MDM Rules/Calculators/A&P                         Patient was given IV fluids and IV nausea medication.  She had been given acetaminophen for her low-grade fever.  Patient noted to have atelectasis order chest x-ray low-grade fever.  She will need to be encouraged to take big deep breaths.   2 view abdominal films were ordered to look for presumed ileus.  Patient has no bowel sounds when I listen to her.  Patient's x-rays do show gas-filled loops of bowel consistent with ileus.  I talked to the patient and her mother that this can sometimes happen after surgery but will correct in time with IV fluid hydration.  We also discussed that she would need to come back in the hospital until her intestinal activity returns.  2:49 AM Dr. Henreitta Leber, surgeon on-call for Dr. Lovell Sheehan will admit patient and see her in the morning.   Final Clinical Impression(s) / ED Diagnoses Final diagnoses:  Intractable vomiting with nausea, unspecified vomiting type  Ileus following gastrointestinal surgery [facility])    Rx / DC Orders  Plan admission  Devoria Albe, MD, Concha Pyo, MD 11/01/20 [phone]

## 2020-11-02 ENCOUNTER — Inpatient Hospital Stay (HOSPITAL_COMMUNITY): Payer: Medicaid Other

## 2020-11-02 LAB — BASIC METABOLIC PANEL
Anion gap: 17 — ABNORMAL HIGH (ref 5–15)
BUN: 20 mg/dL (ref 6–20)
CO2: 24 mmol/L (ref 22–32)
Calcium: 8.1 mg/dL — ABNORMAL LOW (ref 8.9–10.3)
Chloride: 94 mmol/L — ABNORMAL LOW (ref 98–111)
Creatinine, Ser: 1.09 mg/dL — ABNORMAL HIGH (ref 0.44–1.00)
GFR, Estimated: >60 mL/min (ref >60)
Glucose, Bld: 133 mg/dL — ABNORMAL HIGH (ref 70–99)
Potassium: 2.8 mmol/L — ABNORMAL LOW (ref 3.5–5.1)
Sodium: 135 mmol/L (ref 135–145)

## 2020-11-02 LAB — CBC WITH DIFFERENTIAL/PLATELET
Abs Immature Granulocytes: 0.24 10*3/uL — ABNORMAL HIGH (ref 0.00–0.07)
Basophils Absolute: 0 10*3/uL (ref 0.0–0.1)
Basophils Relative: 0 %
Eosinophils Absolute: 0.1 10*3/uL (ref 0.0–0.5)
Eosinophils Relative: 1 %
HCT: 38.2 % (ref 36.0–46.0)
Hemoglobin: 12.9 g/dL (ref 12.0–15.0)
Immature Granulocytes: 3 %
Lymphocytes Relative: 8 %
Lymphs Abs: 0.6 10*3/uL — ABNORMAL LOW (ref 0.7–4.0)
MCH: 31.5 pg (ref 26.0–34.0)
MCHC: 33.8 g/dL (ref 30.0–36.0)
MCV: 93.2 fL (ref 80.0–100.0)
Monocytes Absolute: 1.1 10*3/uL — ABNORMAL HIGH (ref 0.1–1.0)
Monocytes Relative: 14 %
Neutro Abs: 5.8 10*3/uL (ref 1.7–7.7)
Neutrophils Relative %: 74 %
Platelets: 257 10*3/uL (ref 150–400)
RBC: 4.1 MIL/uL (ref 3.87–5.11)
RDW: 13.2 % (ref 11.5–15.5)
WBC Morphology: INCREASED
WBC: 7.8 10*3/uL (ref 4.0–10.5)
nRBC: 0.3 % — ABNORMAL HIGH (ref 0.0–0.2)

## 2020-11-02 LAB — CBG MONITORING, ED: Glucose-Capillary: 149 mg/dL — ABNORMAL HIGH (ref 70–99)

## 2020-11-02 MED ORDER — POTASSIUM CHLORIDE 10 MEQ/100ML IV SOLN
10.0000 meq | INTRAVENOUS | Status: AC
  Administered 2020-11-02 (×4): 10 meq via INTRAVENOUS
  Filled 2020-11-02 (×2): qty 100

## 2020-11-02 MED ORDER — LACTATED RINGERS IV BOLUS
1000.0000 mL | Freq: Once | INTRAVENOUS | Status: AC
  Administered 2020-11-02: 10:00:00 1000 mL via INTRAVENOUS

## 2020-11-02 MED ORDER — ACETAMINOPHEN 500 MG PO TABS
1000.0000 mg | ORAL_TABLET | Freq: Four times a day (QID) | ORAL | Status: DC | PRN
  Administered 2020-11-02 – 2020-11-06 (×3): 1000 mg via ORAL
  Filled 2020-11-02 (×3): qty 2

## 2020-11-02 NOTE — ED Notes (Signed)
Patient found sitting on ER floor beside crash cart.  When asked what she was doing, patient stated "I don't know."  Reoriented and escorted back to stretcher.

## 2020-11-02 NOTE — ED Notes (Signed)
Patient again attempting to get drinks out of patient refrigerator.  Patient yelled loudly at this nurse "Can I just get a bottle to drink."  Reinforced NPO status.

## 2020-11-02 NOTE — Progress Notes (Signed)
°   11/02/20 0940  Assess: MEWS Score  Temp 99.2 F (37.3 C)  BP 134/80  Pulse Rate (!) 125  Resp (!) 22  SpO2 96 %  O2 Device Room Air  Assess: MEWS Score  MEWS Temp 0  MEWS Systolic 0  MEWS Pulse 2  MEWS RR 1  MEWS LOC 0  MEWS Score 3  MEWS Score Color Yellow  Assess: if the MEWS score is Yellow or Red  Were vital signs taken at a resting state? No  Focused Assessment No change from prior assessment  Early Detection of Sepsis Score *See Row Information* Medium  MEWS guidelines implemented *See Row Information* Yes  Treat  MEWS Interventions Administered scheduled meds/treatments;Administered prn meds/treatments  Pain Scale 0-10  Pain Score 0  Take Vital Signs  Increase Vital Sign Frequency  Yellow: Q 2hr X 2 then Q 4hr X 2, if remains yellow, continue Q 4hrs  Escalate  MEWS: Escalate Yellow: discuss with charge nurse/RN and consider discussing with provider and RRT  Notify: Charge Nurse/RN  Name of Charge Nurse/RN Notified C. Thad Ranger, RN  Date Charge Nurse/RN Notified 11/02/20  Time Charge Nurse/RN Notified 0940  Notify: Provider  Provider Name/Title Algis Greenhouse, MD  Date Provider Notified 11/02/20  Time Provider Notified 1015  Notification Type Page  Notification Reason Change in status  Response See new orders  Date of Provider Response 11/02/20  Time of Provider Response 1010

## 2020-11-02 NOTE — ED Notes (Signed)
Patient rummaging through supplies at the nursing station.  Redirected back to stretcher.

## 2020-11-02 NOTE — Progress Notes (Signed)
   11/02/20 1208  Assess: MEWS Score  Temp 100.2 F (37.9 C)  BP 124/66  Pulse Rate (!) 113  Resp 20  Level of Consciousness Alert  SpO2 92 %  O2 Device Room Air  Assess: MEWS Score  MEWS Temp 0  MEWS Systolic 0  MEWS Pulse 2  MEWS RR 0  MEWS LOC 0  MEWS Score 2  MEWS Score Color Yellow  Assess: if the MEWS score is Yellow or Red  Were vital signs taken at a resting state? Yes  Focused Assessment No change from prior assessment  Early Detection of Sepsis Score *See Row Information* Low  MEWS guidelines implemented *See Row Information* No, previously yellow, continue vital signs every 4 hours  Treat  Pain Scale 0-10  Pain Score 0

## 2020-11-02 NOTE — ED Notes (Signed)
Received care of patient resting on stretcher.  0 s/s acute distress.  SR up x2.  Call bell in reach.

## 2020-11-02 NOTE — Progress Notes (Signed)
[facility] Progress Note     Subjective: Patient with more flatus. No BM this AM. Was confused once got to the floor and had temperature with tachycardia, improved with bolus and tylenol.  Objective: Vital signs in last 24 hours: Temp:  [98.7 F (37.1 C)-100.5 F (38.1 C)] 99.1 F (37.3 C) (12/16 1427) Pulse Rate:  [98-136] 100 (12/16 1427) Resp:  [16-27] 20 (12/16 1427) BP: (115-134)/(66-80) 121/73 (12/16 1427) SpO2:  [91 %-98 %] 94 % (12/16 1427) Weight:  [95.2 kg] 95.2 kg (12/16 0940) Last BM Date: 11/02/20  Intake/Output from previous day: 12/15 0701 - 12/16 0700 In: 1000 [IV Piggyback:1000] Out: -  Intake/Output this shift: Total I/O In: 0  Out: 40 [Drains:40]  General appearance: alert, cooperative and no distress Resp: normal work of breathing GI: soft, mildly distended, mildly tender, dressing in place and JP with SS fluid  Lab Results:  Recent Labs    10/31/20 2317 11/02/20 0422  WBC 4.3 7.8  HGB 13.0 12.9  HCT 38.4 38.2  PLT 258 257   BMET Recent Labs    10/31/20 2317 11/02/20 0422  NA 130* 135  K 4.0 2.8*  CL 94* 94*  CO2 24 24  GLUCOSE 151* 133*  BUN 12 20  CREATININE 0.80 1.09*  CALCIUM 8.1* 8.1*   PT/INR No results for input(s): LABPROT, INR in the last 72 hours.  Studies/Results: DG Abd 1 View  Result Date: 11/02/2020 CLINICAL DATA:  Recent appendectomy.  Abdominal pain EXAM: ABDOMEN - 1 VIEW COMPARISON:  11/01/2020 FINDINGS: Distended small bowel loops appears slightly improved in the interval. Colon decompressed. IUD noted in the pelvis. IMPRESSION: Mild improvement in small bowel dilatation.  Colon decompressed. Electronically Signed   By: Marlan Palau M.D.   On: 11/02/2020 10:32   DG Chest Port 1 View  Result Date: 10/31/2020 CLINICAL DATA:  Fever. Post recent appendectomy. EXAM: PORTABLE CHEST 1 VIEW COMPARISON:  None. FINDINGS: Lung volumes are low. Patchy bibasilar opacities favoring atelectasis. There may  be a small left pleural effusion. No pneumothorax. The heart is normal in size with normal mediastinal contours. No acute osseous abnormalities are seen. IMPRESSION: 1. Low lung volumes with patchy bibasilar opacities, favoring atelectasis. 2. Possible small left pleural effusion. Electronically Signed   By: Narda Rutherford M.D.   On: 10/31/2020 23:13   DG Abd Portable 2 Views  Result Date: 11/01/2020 CLINICAL DATA:  Vomiting following appendectomy EXAM: PORTABLE ABDOMEN - 2 VIEW COMPARISON:  None. FINDINGS: Two view radiograph of the abdomen demonstrates multiple gas-filled mildly dilated loops of small bowel within the mid abdomen demonstrating air-fluid levels at similar levels on upright examination most in keeping with a postoperative adynamic ileus. No free intraperitoneal gas. Surgical drainage catheter noted within the right hemipelvis. Intrauterine device in place. Mild lumbar levoscoliosis again noted IMPRESSION: Multiple gas-filled dilated loops of bowel within the mid abdomen most in keeping with an ileus. Electronically Signed   By: Helyn Numbers MD   On: 11/01/2020 02:14    Anti-infectives: Anti-infectives (From admission, onward)   Start     Dose/Rate Route Frequency Ordered Stop   11/01/20 1130  cefoTEtan (CEFOTAN) 2 g in sodium chloride 0.9 % 100 mL IVPB        2 g 200 mL/hr over 30 Minutes Intravenous Every 12 hours 11/01/20 1115 11/06/20 0959      Assessment/Plan: [name] is a 44 yo with post operative ileus related to the infection from her perforated appendicitis. She  is improving but is having some fevers and confusion. She has tolerated some sips and meds now. Xray improved dilation. Could develop intraabdominal abscess if fever curve worsening.   PRN for pain Home meds with sips, takes seroquel and other psych meds so will try to continue these with her confusion, improved now  Father at bedside Tachycardia related to fever likely, monitor  Labs tomorrow LR @  75, K replaced JP in place  SCDs, lovenox    LOS: 1 day    Evelyn Simpson 11/02/2020

## 2020-11-02 NOTE — ED Notes (Signed)
Patient attempted to take a cup of ice off of a phlebotomy cart.  Patient stated "lets be civil."  Reoriented back to stretcher.

## 2020-11-02 NOTE — ED Notes (Signed)
Patient jerked arm away when phlebotomy attempted to obtain am labs.  Patient refusing bloodwork at this time.

## 2020-11-02 NOTE — Progress Notes (Signed)
   11/02/20 0940  Assess: MEWS Score  Temp 99.2 F (37.3 C)  BP 134/80  Pulse Rate (!) 125  Resp (!) 22  SpO2 96 %  O2 Device Room Air  Assess: MEWS Score  MEWS Temp 0  MEWS Systolic 0  MEWS Pulse 2  MEWS RR 1  MEWS LOC 0  MEWS Score 3  MEWS Score Color Yellow  Assess: if the MEWS score is Yellow or Red  Were vital signs taken at a resting state? No  Focused Assessment No change from prior assessment  Early Detection of Sepsis Score *See Row Information* Medium  MEWS guidelines implemented *See Row Information* No, vital signs rechecked  Treat  MEWS Interventions Administered scheduled meds/treatments;Administered prn meds/treatments  Pain Scale 0-10  Pain Score 0  Notify: Charge Nurse/RN  Name of Charge Nurse/RN Notified C. Thad Ranger, RN  Date Charge Nurse/RN Notified 11/02/20  Time Charge Nurse/RN Notified [phone]

## 2020-11-02 NOTE — ED Notes (Signed)
Patient attempting to get drinks out of patient refrigerator independently.  Education provided regarding NPO status and need for hospitalization.

## 2020-11-02 NOTE — Progress Notes (Signed)
EKG completed, notified Dr Henreitta Leber. Telemetry applied. HR in the 110s currently, patient remains confused. When asked to state name she answered but when asked where she was she just laughed and would not answer. Father at bedside. Dr. Henreitta Leber aware, stated to hold off on trying to place NG tube while patient confused. Nursing will continue to monitor.

## 2020-11-03 DIAGNOSIS — E876 Hypokalemia: Secondary | ICD-10-CM

## 2020-11-03 LAB — BASIC METABOLIC PANEL
Anion gap: 11 (ref 5–15)
BUN: 13 mg/dL (ref 6–20)
CO2: 28 mmol/L (ref 22–32)
Calcium: 7.9 mg/dL — ABNORMAL LOW (ref 8.9–10.3)
Chloride: 96 mmol/L — ABNORMAL LOW (ref 98–111)
Creatinine, Ser: 0.92 mg/dL (ref 0.44–1.00)
GFR, Estimated: >60 mL/min (ref >60)
Glucose, Bld: 104 mg/dL — ABNORMAL HIGH (ref 70–99)
Potassium: 2.9 mmol/L — ABNORMAL LOW (ref 3.5–5.1)
Sodium: 135 mmol/L (ref 135–145)

## 2020-11-03 LAB — CBC WITH DIFFERENTIAL/PLATELET
Band Neutrophils: 5 %
Basophils Absolute: 0 10*3/uL (ref 0.0–0.1)
Basophils Relative: 0 %
Eosinophils Absolute: 0.3 10*3/uL (ref 0.0–0.5)
Eosinophils Relative: 2 %
HCT: 33.4 % — ABNORMAL LOW (ref 36.0–46.0)
Hemoglobin: 11.1 g/dL — ABNORMAL LOW (ref 12.0–15.0)
Lymphocytes Relative: 11 %
Lymphs Abs: 1.5 10*3/uL (ref 0.7–4.0)
MCH: 31.2 pg (ref 26.0–34.0)
MCHC: 33.2 g/dL (ref 30.0–36.0)
MCV: 93.8 fL (ref 80.0–100.0)
Metamyelocytes Relative: 1 %
Monocytes Absolute: 1.2 10*3/uL — ABNORMAL HIGH (ref 0.1–1.0)
Monocytes Relative: 9 %
Myelocytes: 2 %
Neutro Abs: 10 10*3/uL — ABNORMAL HIGH (ref 1.7–7.7)
Neutrophils Relative %: 70 %
Platelets: 258 10*3/uL (ref 150–400)
RBC: 3.56 MIL/uL — ABNORMAL LOW (ref 3.87–5.11)
RDW: 13.8 % (ref 11.5–15.5)
WBC: 13.3 10*3/uL — ABNORMAL HIGH (ref 4.0–10.5)
nRBC: 0.2 % (ref 0.0–0.2)

## 2020-11-03 LAB — I-STAT BETA HCG BLOOD, ED (NOT ORDERABLE): I-stat hCG, quantitative: <5 m[IU]/mL (ref <5)

## 2020-11-03 MED ORDER — DOCUSATE SODIUM 100 MG PO CAPS
100.0000 mg | ORAL_CAPSULE | Freq: Two times a day (BID) | ORAL | Status: DC
  Administered 2020-11-03 – 2020-11-09 (×12): 100 mg via ORAL
  Filled 2020-11-03 (×14): qty 1

## 2020-11-03 MED ORDER — KCL IN DEXTROSE-NACL 20-5-0.45 MEQ/L-%-% IV SOLN
INTRAVENOUS | Status: AC
  Filled 2020-11-03: qty 1000

## 2020-11-03 MED ORDER — INFLUENZA VAC SPLIT QUAD 0.5 ML IM SUSY
0.5000 mL | PREFILLED_SYRINGE | INTRAMUSCULAR | Status: AC
  Administered 2020-11-05: 08:00:00 0.5 mL via INTRAMUSCULAR
  Filled 2020-11-03: qty 0.5

## 2020-11-03 MED ORDER — POTASSIUM CHLORIDE 10 MEQ/100ML IV SOLN
10.0000 meq | INTRAVENOUS | Status: AC
  Administered 2020-11-03 (×3): 10 meq via INTRAVENOUS
  Filled 2020-11-03 (×4): qty 100

## 2020-11-03 NOTE — Progress Notes (Addendum)
[facility] Progress Note     Subjective: Multiple BMs yesterday. Feeling better and wanting to eat more. Says she is having no pain. Did have increase in her leukocytosis on antibiotics post op for 5 days for perforation.    Objective: Vital signs in last 24 hours: Temp:  [98.2 F (36.8 C)-100.2 F (37.9 C)] 98.7 F (37.1 C) (12/17 0535) Pulse Rate:  [92-113] 92 (12/17 0535) Resp:  [18-20] 18 (12/17 0535) BP: (121-144)/(66-87) 144/87 (12/17 0535) SpO2:  [92 %-97 %] 95 % (12/17 0535) Last BM Date: 11/02/20  Intake/Output from previous day: 12/16 0701 - 12/17 0700 In: 2751.8 [I.V.:2350.5; IV Piggyback:401.2] Out: 90 [Drains:90] Intake/Output this shift: Total I/O In: 360 [P.O.:360] Out: -   General appearance: alert, cooperative and no distress Resp: normal work of breathing GI: soft, nondistended, staples c/d/i without erythema or drainage, JP with SS fluid   Lab Results:  Recent Labs    11/02/20 0422 11/03/20 0547  WBC 7.8 13.3*  HGB 12.9 11.1*  HCT 38.2 33.4*  PLT 257 258   BMET Recent Labs    11/02/20 0422 11/03/20 0547  NA 135 135  K 2.8* 2.9*  CL 94* 96*  CO2 24 28  GLUCOSE 133* 104*  BUN 20 13  CREATININE 1.09* 0.92  CALCIUM 8.1* 7.9*   PT/INR No results for input(s): LABPROT, INR in the last 72 hours.  Studies/Results: DG Abd 1 View  Result Date: 11/02/2020 CLINICAL DATA:  Recent appendectomy.  Abdominal pain EXAM: ABDOMEN - 1 VIEW COMPARISON:  11/01/2020 FINDINGS: Distended small bowel loops appears slightly improved in the interval. Colon decompressed. IUD noted in the pelvis. IMPRESSION: Mild improvement in small bowel dilatation.  Colon decompressed. Electronically Signed   By: Marlan Palau M.D.   On: 11/02/2020 10:32    Anti-infectives: Anti-infectives (From admission, onward)   Start     Dose/Rate Route Frequency Ordered Stop   11/01/20 1130  cefoTEtan (CEFOTAN) 2 g in sodium chloride 0.9 % 100 mL IVPB        2  g 200 mL/hr over 30 Minutes Intravenous Every 12 hours 11/01/20 1115 11/06/20 0959      Assessment/Plan: Ms. Votaw is a 44 yo s/p Laparoscopic appendectomy with Dr. Lovell Sheehan 12/13 who was readmitted with ileus that is now resolving after bowel rest (never tolerated NG tube).  She is having Bms and tolerating diet. Hypokalemia continues but will continue to replace for now.   PRN for pain Is, OOB HD improved and tachycardia from yesterday resolved, d/c the cardiac monitor Full liquid diet now and adv as tolerated Leukocytosis trending up, on cefotetan post op for perforation and contamination, JP in place with SS fluid, could be developing an abscess that will need drained May need CT if worsening leukocytosis K replaced,for hypokalemia related to vomiting, IVF changed to D51/2NS + 20 K @ 50 for now until K is improved Labs in the AM ordered  SCDs, lovenox   Dr. Katharine Look covering tomorrow and patient aware.    LOS: 2 days    Lucretia Roers 11/03/2020

## 2020-11-03 NOTE — Progress Notes (Signed)
Patient had 3 BM's throughout the night. Tolerated sips of water and PO medications without any nausea and vomiting. States that she feels a little better this morning.Will continue to monitor.

## 2020-11-04 ENCOUNTER — Inpatient Hospital Stay (HOSPITAL_COMMUNITY): Payer: Medicaid Other

## 2020-11-04 DIAGNOSIS — K651 Peritoneal abscess: Secondary | ICD-10-CM | POA: Diagnosis present

## 2020-11-04 LAB — BASIC METABOLIC PANEL
Anion gap: 9 (ref 5–15)
BUN: 6 mg/dL (ref 6–20)
CO2: 28 mmol/L (ref 22–32)
Calcium: 8 mg/dL — ABNORMAL LOW (ref 8.9–10.3)
Chloride: 100 mmol/L (ref 98–111)
Creatinine, Ser: 0.72 mg/dL (ref 0.44–1.00)
GFR, Estimated: >60 mL/min (ref >60)
Glucose, Bld: 91 mg/dL (ref 70–99)
Potassium: 3 mmol/L — ABNORMAL LOW (ref 3.5–5.1)
Sodium: 137 mmol/L (ref 135–145)

## 2020-11-04 LAB — CBC WITH DIFFERENTIAL/PLATELET
Abs Immature Granulocytes: 1.37 10*3/uL — ABNORMAL HIGH (ref 0.00–0.07)
Basophils Absolute: 0 10*3/uL (ref 0.0–0.1)
Basophils Relative: 0 %
Eosinophils Absolute: 0.3 10*3/uL (ref 0.0–0.5)
Eosinophils Relative: 2 %
HCT: 34.3 % — ABNORMAL LOW (ref 36.0–46.0)
Hemoglobin: 11.4 g/dL — ABNORMAL LOW (ref 12.0–15.0)
Lymphocytes Relative: 15 %
Lymphs Abs: 2.3 10*3/uL (ref 0.7–4.0)
MCH: 31.1 pg (ref 26.0–34.0)
MCHC: 33.2 g/dL (ref 30.0–36.0)
MCV: 93.5 fL (ref 80.0–100.0)
Metamyelocytes Relative: 1 %
Monocytes Absolute: 0.8 10*3/uL (ref 0.1–1.0)
Monocytes Relative: 5 %
Myelocytes: 2 %
Neutro Abs: 11.2 10*3/uL — ABNORMAL HIGH (ref 1.7–7.7)
Neutrophils Relative %: 73 %
Platelets: 274 10*3/uL (ref 150–400)
Promyelocytes Relative: 2 %
RBC: 3.67 MIL/uL — ABNORMAL LOW (ref 3.87–5.11)
RDW: 14 % (ref 11.5–15.5)
WBC: 15.3 10*3/uL — ABNORMAL HIGH (ref 4.0–10.5)
nRBC: 0.3 % — ABNORMAL HIGH (ref 0.0–0.2)

## 2020-11-04 LAB — MAGNESIUM: Magnesium: 2.1 mg/dL (ref 1.7–2.4)

## 2020-11-04 MED ORDER — TRAMADOL HCL 50 MG PO TABS
50.0000 mg | ORAL_TABLET | Freq: Four times a day (QID) | ORAL | Status: DC | PRN
  Administered 2020-11-04 – 2020-11-08 (×5): 50 mg via ORAL
  Filled 2020-11-04 (×5): qty 1

## 2020-11-04 MED ORDER — IOHEXOL 300 MG/ML  SOLN
100.0000 mL | Freq: Once | INTRAMUSCULAR | Status: AC | PRN
  Administered 2020-11-04: 100 mL via INTRAVENOUS

## 2020-11-04 MED ORDER — ALPRAZOLAM 1 MG PO TABS: 1.0000 mg | ORAL_TABLET | Freq: Every evening | ORAL | Status: DC | PRN

## 2020-11-04 MED ORDER — ALPRAZOLAM 0.5 MG PO TABS
0.5000 mg | ORAL_TABLET | Freq: Two times a day (BID) | ORAL | Status: DC
  Administered 2020-11-04 – 2020-11-09 (×11): 0.5 mg via ORAL
  Filled 2020-11-04 (×11): qty 1

## 2020-11-04 MED ORDER — IOHEXOL 9 MG/ML PO SOLN
ORAL | Status: AC
  Administered 2020-11-04: 12:00:00 1000 mL
  Filled 2020-11-04: qty 1000

## 2020-11-04 MED ORDER — IBUPROFEN 200 MG PO TABS
800.0000 mg | ORAL_TABLET | Freq: Four times a day (QID) | ORAL | Status: DC | PRN
  Administered 2020-11-05: 800 mg via ORAL
  Filled 2020-11-04: qty 4

## 2020-11-04 MED ORDER — POTASSIUM CHLORIDE CRYS ER 20 MEQ PO TBCR
20.0000 meq | EXTENDED_RELEASE_TABLET | ORAL | Status: AC
  Administered 2020-11-04 (×2): 20 meq via ORAL
  Filled 2020-11-04 (×2): qty 1

## 2020-11-04 MED ORDER — ALPRAZOLAM ER 1 MG PO TB24: 1.0000 mg | ORAL_TABLET | Freq: Every day | ORAL | Status: DC

## 2020-11-04 NOTE — Progress Notes (Signed)
Reviewed CT results.  Recommend IR guided drainage if possible and IR stated they can attempt it.  Due to logistics, care will be expedited if patient is transferred to [facility] for the procedure.  Transfer request placed and Dr. Mikey College will be accepting physician.  Pt and family member verbalized understanding.  RN notified of plan as well

## 2020-11-04 NOTE — Progress Notes (Signed)
Subjective:  CC: Evelyn Simpson is a 44 y.o. female  Hospital stay day 3,   acute appendicitis, subsequent ileus  HPI: No acute issues overnight.  Tolerating soft diet, continues to have BMs  ROS:  General: Denies weight loss, weight gain, fatigue, fevers, chills, and night sweats. Heart: Denies chest pain, palpitations, racing heart, irregular heartbeat, leg pain or swelling, and decreased activity tolerance. Respiratory: Denies breathing difficulty, shortness of breath, wheezing, cough, and sputum. GI: Denies change in appetite, heartburn, nausea, vomiting, constipation, diarrhea, and blood in stool. GU: Denies difficulty urinating, pain with urinating, urgency, frequency, blood in urine.   Objective:   Temp:  [97.5 F (36.4 C)-99.1 F (37.3 C)] 98.5 F (36.9 C) (12/18 0348) Pulse Rate:  [84-102] 102 (12/18 0348) Resp:  [16-20] 19 (12/18 0348) BP: (128-135)/(69-94) 128/69 (12/18 0348) SpO2:  [97 %-99 %] 99 % (12/18 0348)     Height: 5\' 4"  (162.6 cm) Weight: 95.2 kg BMI (Calculated): 36.01   Intake/Output this shift:   Intake/Output Summary (Last 24 hours) at 11/04/2020 1113 Last data filed at 11/04/2020 0600 Gross per 24 hour  Intake 1176.44 ml  Output 20 ml  Net 1156.44 ml    Constitutional :  alert, cooperative, appears stated age and no distress  Respiratory:  clear to auscultation bilaterally  Cardiovascular:  regular rate and rhythm  Gastrointestinal: soft, no guarding, minimal discomfort noted in RLQ, suparpubic area.  no TTP around incisions. JP with serous discharge  Skin: Cool and moist. Incision staples c/d/i.  Psychiatric: Normal affect, non-agitated, not confused       LABS:  CMP Latest Ref Rng & Units 11/04/2020 11/03/2020 11/02/2020  Glucose 70 - 99 mg/dL 91 11/04/2020) 035(K)  BUN 6 - 20 mg/dL 6 13 20   Creatinine 0.44 - 1.00 mg/dL 093(G 1.82)  Sodium 135 - 145 mmol/L 137 135 135  Potassium 3.5 - 5.1 mmol/L 3.0(L) 2.9(L) 2.8(L)  Chloride 98 - 111  mmol/L 100 96(L) 94(L)  CO2 22 - 32 mmol/L 28 28 24   Calcium 8.9 - 10.3 mg/dL 8.0(L) 7.9(L) 8.1(L)  Total Protein 6.5 - 8.1 g/dL - - -  Total Bilirubin 0.3 - 1.2 mg/dL - - -  Alkaline Phos 38 - 126 U/L - - -  AST 15 - 41 U/L - - -  ALT 0 - 44 U/L - - -   CBC Latest Ref Rng & Units 11/04/2020 11/03/2020 11/02/2020  WBC 4.0 - 10.5 K/uL 15.3(H) 13.3(H) 7.8  Hemoglobin 12.0 - 15.0 g/dL 11.4(L) 11.1(L) 12.9  Hematocrit 36.0 - 46.0 % 34.3(L) 33.4(L) 38.2  Platelets 150 - 400 K/uL 274 258 257    RADS: Pending CT a/p Assessment:   S/p lap appy, subsequent ileus and hypokalemia.    Symptoms continue to improve but noted to have increasing leukocytosis for past couple days. Source of leukocytosis,  Possible intra-abdominal abscess that is not being drained from present JP.  Will obtain CT a/p to assess.  Fortunately, patient minimally symptomatic.  Tolerating diet and pain controlled.  Continue current management with IV abx for now.  Restarting home xanax

## 2020-11-05 DIAGNOSIS — K651 Peritoneal abscess: Principal | ICD-10-CM

## 2020-11-05 DIAGNOSIS — E039 Hypothyroidism, unspecified: Secondary | ICD-10-CM

## 2020-11-05 DIAGNOSIS — K567 Ileus, unspecified: Secondary | ICD-10-CM

## 2020-11-05 DIAGNOSIS — K9189 Other postprocedural complications and disorders of digestive system: Secondary | ICD-10-CM

## 2020-11-05 DIAGNOSIS — F319 Bipolar disorder, unspecified: Secondary | ICD-10-CM

## 2020-11-05 DIAGNOSIS — E876 Hypokalemia: Secondary | ICD-10-CM

## 2020-11-05 LAB — CBC WITH DIFFERENTIAL/PLATELET
Abs Immature Granulocytes: 1.88 10*3/uL — ABNORMAL HIGH (ref 0.00–0.07)
Band Neutrophils: 2 %
Basophils Absolute: 0 10*3/uL (ref 0.0–0.1)
Basophils Relative: 0 %
Eosinophils Absolute: 0.3 10*3/uL (ref 0.0–0.5)
Eosinophils Relative: 2 %
HCT: 32.5 % — ABNORMAL LOW (ref 36.0–46.0)
Hemoglobin: 10.8 g/dL — ABNORMAL LOW (ref 12.0–15.0)
Lymphocytes Relative: 3 %
Lymphs Abs: 0.5 10*3/uL — ABNORMAL LOW (ref 0.7–4.0)
MCH: 31 pg (ref 26.0–34.0)
MCHC: 33.2 g/dL (ref 30.0–36.0)
MCV: 93.4 fL (ref 80.0–100.0)
Metamyelocytes Relative: 1 %
Monocytes Absolute: 1.7 10*3/uL — ABNORMAL HIGH (ref 0.1–1.0)
Monocytes Relative: 10 %
Neutro Abs: 14.4 10*3/uL — ABNORMAL HIGH (ref 1.7–7.7)
Neutrophils Relative %: 82 %
Platelets: 300 10*3/uL (ref 150–400)
RBC: 3.48 MIL/uL — ABNORMAL LOW (ref 3.87–5.11)
RDW: 14.4 % (ref 11.5–15.5)
WBC: 17.2 10*3/uL — ABNORMAL HIGH (ref 4.0–10.5)
nRBC: 0.2 % (ref 0.0–0.2)

## 2020-11-05 LAB — BASIC METABOLIC PANEL
Anion gap: 5 (ref 5–15)
BUN: <5 mg/dL — ABNORMAL LOW (ref 6–20)
CO2: 27 mmol/L (ref 22–32)
Calcium: 7.8 mg/dL — ABNORMAL LOW (ref 8.9–10.3)
Chloride: 104 mmol/L (ref 98–111)
Creatinine, Ser: 0.81 mg/dL (ref 0.44–1.00)
GFR, Estimated: >60 mL/min (ref >60)
Glucose, Bld: 98 mg/dL (ref 70–99)
Potassium: 3.5 mmol/L (ref 3.5–5.1)
Sodium: 136 mmol/L (ref 135–145)

## 2020-11-05 LAB — MAGNESIUM: Magnesium: 1.9 mg/dL (ref 1.7–2.4)

## 2020-11-05 LAB — PHOSPHORUS: Phosphorus: 2.6 mg/dL (ref 2.5–4.6)

## 2020-11-05 MED ORDER — PIPERACILLIN-TAZOBACTAM 3.375 G IVPB
3.3750 g | Freq: Three times a day (TID) | INTRAVENOUS | Status: DC
  Administered 2020-11-05 – 2020-11-09 (×12): 3.375 g via INTRAVENOUS
  Filled 2020-11-05 (×12): qty 50

## 2020-11-05 MED ORDER — ENOXAPARIN SODIUM 40 MG/0.4ML ~~LOC~~ SOLN
40.0000 mg | SUBCUTANEOUS | Status: DC
  Administered 2020-11-07 – 2020-11-09 (×3): 40 mg via SUBCUTANEOUS
  Filled 2020-11-05 (×3): qty 0.4

## 2020-11-05 NOTE — H&P (Signed)
History and Physical    DOA: 10/31/2020  PCP: Rebecka Apley, NP  Patient coming from: [facility] inpatient  Chief Complaint: Transferred from [facility] for intra-abdominal abscess/drainage  HPI: Evelyn Simpson is a 44 y.o. female with history h/o ADD, depression/bipolar disorder, hypertension, hypothyroidism, hypercholesterolemia had perforated appendicitis and underwent laparoscopic appendectomy with drain placement on 12/13, was d/c home by surgical service on 12/14. She returned to [facility] ED that evening with nausea and vomiting. She vomited multiple times and was unable to keep anything down. She had a Xray that demonstrated post operative ileus.  She was hospitalized at [facility], medical management was pursued.  Patient did not tolerate NG tube in spite of multiple attempts, was kept n.p.o. and diet slowly advanced over the last week.  Per chart review, she was tolerating soft diet yesterday but noted to have increasing leukocytosis over the last couple of days-possible intra-abdominal abscess that is not being drained by present JP was suspected and CT abdomen pelvis was obtained while continuing IV antibiotics.  CT revealed "Multiple rim enhancing fluid collections throughout the abdomen and pelvis, some of which may be partially contiguous.JP-drain is in the RIGHT lower quadrant and is not immediately adjacent to any of the above described collections. Mild dilation of the proximal small bowel which is favored to reflect ileus. Enteric contrast has progressed to the proximal small bowel.Small bilateral pleural effusions" General surgery discussed with IR who recommended that patient be transferred to [facility] campus for further intervention.  It appears that case was discussed with Dr. Mikey College who accepted patient to [facility].  On arrival here patient is afebrile, hemodynamically stable and has orders for NPO/IV fluids/cefotetan/IV morphine. Mother at bedside and has questions about JP drain and  further plan.   Review of Systems: As per HPI otherwise 10 point review of systems negative.    Past Medical History:  Diagnosis Date  . ADD (attention deficit disorder)   . Anxiety   . Bipolar 1 disorder (HCC)   . Depression   . HTN (hypertension)   . Hypercholesteremia   . Hypothyroidism     Past Surgical History:  Procedure Laterality Date  . APPENDECTOMY    . CERVIX SURGERY    . COLONOSCOPY WITH PROPOFOL N/A 05/10/2019   Procedure: COLONOSCOPY WITH PROPOFOL;  Surgeon: Corbin Ade, MD;  Location: [facility]O SUITE;  Service: Endoscopy;  Laterality: N/A;  2:30pm - pt does not want to move up  . HAND EXPLORATION Left    removal of glass from finger  . LAPAROSCOPIC APPENDECTOMY N/A 10/30/2020   Procedure: APPENDECTOMY LAPAROSCOPIC;  Surgeon: Franky Macho, MD;  Location: [facility] ORS;  Service: General;  Laterality: N/A;  . MANDIBLE SURGERY      Social history:  reports that she has been smoking cigarettes. She has a 28.00 pack-year smoking history. She has never used smokeless tobacco. She reports current alcohol use. She reports previous drug use.   No Known Allergies  Family History  Problem Relation Age of Onset  . Breast cancer Mother   . Liver disease Neg Hx   . Colon cancer Neg Hx       Prior to Admission medications   Medication Sig Start Date End Date Taking? Authorizing Provider  ALPRAZolam (XANAX XR) 1 MG 24 hr tablet Take 1 mg by mouth every morning.    Yes [provider]  ALPRAZolam Prudy Feeler) 1 MG tablet Take 1 tablet by mouth at bedtime as needed.   Yes [provider]  desvenlafaxine (PRISTIQ) 100 MG 24 hr tablet Take 100 mg by mouth 2 (two) times daily.    Yes [provider]  hydrOXYzine (VISTARIL) 25 MG capsule Take 25 mg by mouth 3 (three) times daily as needed.   Yes [provider]  levothyroxine (SYNTHROID) 112 MCG tablet Take 112 mcg by mouth daily before breakfast.  01/19/19  Yes [provider]  LINZESS  290 MCG CAPS capsule TAKE ONE (1) CAPSULE EACH DAY Patient taking differently: Take 290 mcg by mouth daily before breakfast. 10/08/19  Yes Gelene Mink, NP  lisinopril-hydrochlorothiazide (ZESTORETIC) 20-12.5 MG tablet Take 1 tablet by mouth daily. 10/01/17  Yes [provider]  ondansetron (ZOFRAN-ODT) 8 MG disintegrating tablet Take 8 mg by mouth 3 (three) times daily. 10/28/20  Yes [provider]  oxyCODONE-acetaminophen (PERCOCET) 5-325 MG tablet Take 1 tablet by mouth every 4 (four) hours as needed for severe pain. 10/31/20 10/31/21 Yes Franky Macho, MD  QUEtiapine (SEROQUEL) 300 MG tablet Take 600 mg by mouth at bedtime.    Yes [provider]  simvastatin (ZOCOR) 20 MG tablet Take 20 mg by mouth daily.  10/01/17  Yes [provider]  Vitamin D, Ergocalciferol, (DRISDOL) 1.25 MG (50000 UT) CAPS capsule Take 50,000 Units by mouth once a week. Saturdays 03/13/18  Yes [provider]  amoxicillin-clavulanate (AUGMENTIN) 875-125 MG tablet Take 1 tablet by mouth 2 (two) times daily. 10/31/20   Franky Macho, MD  potassium chloride 20 MEQ TBCR Take 10 mEq by mouth daily. 10/31/20   Franky Macho, MD    Physical Exam: Vitals:   11/04/20 0348 11/04/20 2015 11/05/20 0517 11/05/20 1420  BP: 128/69 131/84 120/76 126/85  Pulse: (!) 102 84 82 78  Resp: 19 18 18 16   Temp: 98.5 F (36.9 C) 97.6 F (36.4 C) (!) 97.2 F (36.2 C) 97.6 F (36.4 C)  TempSrc:    Oral  SpO2: 99% 98% 98% 98%  Weight:      Height:        Constitutional: NAD, calm, comfortable Eyes: PERRL, lids and conjunctivae normal ENMT: Mucous membranes are moist. Posterior pharynx clear of any exudate or lesions.Normal dentition.  Neck: normal, supple, no masses, no thyromegaly Respiratory: decreased BS at bases , clear to auscultation overall, no wheezing, no crackles. Normal respiratory effort. No accessory muscle use.  Cardiovascular: Regular rate and rhythm, no murmurs / rubs /  gallops. No extremity edema. 2+ pedal pulses. No carotid bruits.  Abdomen: mild diffuse lower quadrant tenderness, left JP drain with ~75 ml serosanguinous drainage. Bowel sounds positive.  Musculoskeletal: no clubbing / cyanosis. No joint deformity upper and lower extremities. Good ROM, no contractures. Normal muscle tone.  Neurologic: CN 2-12 grossly intact. Sensation intact, DTR normal. Strength 5/5 in all 4.  Psychiatric: Normal judgment and insight. Alert and oriented x 3. Normal mood.  SKIN/catheters: no rashes, lesions, ulcers. No induration  Labs on Admission: I have personally reviewed following labs and imaging studies  CBC: Recent Labs  Lab 10/31/20 2317 11/02/20 0422 11/03/20 0547 11/04/20 0451 11/05/20 0442  WBC 4.3 7.8 13.3* 15.3* 17.2*  NEUTROABS  --  5.8 10.0* 11.2* 14.4*  HGB 13.0 12.9 11.1* 11.4* 10.8*  HCT 38.4 38.2 33.4* 34.3* 32.5*  MCV 92.3 93.2 93.8 93.5 93.4  PLT 258 257 258 274 300   Basic Metabolic Panel: Recent Labs  Lab 10/30/20 0558 10/31/20 0542 10/31/20 2317 11/02/20 0422 11/03/20 0547 11/04/20 0451 11/05/20 0442  NA 131* 132*  130* 135 135 137 136  K 3.4* 3.4* 4.0 2.8* 2.9* 3.0* 3.5  CL 96* 99 94* 94* 96* 100 104  CO2 24 25 24 24 28 28 27   GLUCOSE 120* 130* 151* 133* 104* 91 98  BUN 9 12 12 20 13 6  <5*  CREATININE 0.75 0.91 0.80 1.09* 0.92 0.72 0.81  CALCIUM 8.2* 7.9* 8.1* 8.1* 7.9* 8.0* 7.8*  MG 2.7* 2.3  --   --   --  2.1 1.9  PHOS 1.6* 2.1*  --   --   --   --  2.6   GFR: Estimated Creatinine Clearance: 99.2 mL/min (by C-G formula based on SCr of 0.81 mg/dL). Recent Labs  Lab 10/31/20 2304 10/31/20 2317 11/02/20 0422 11/03/20 0547 11/04/20 0451 11/05/20 0442  WBC  --    < > 7.8 13.3* 15.3* 17.2*  LATICACIDVEN 1.5  --   --   --   --   --    < > = values in this interval not displayed.   Liver Function Tests: Recent Labs  Lab 10/31/20 2317  AST 33  ALT 20  ALKPHOS 74  BILITOT 2.3*  PROT 6.9  ALBUMIN 3.0*   No results  for input(s): LIPASE, AMYLASE in the last 168 hours. No results for input(s): AMMONIA in the last 168 hours. Coagulation Profile: No results for input(s): INR, PROTIME in the last 168 hours. Cardiac Enzymes: No results for input(s): CKTOTAL, CKMB, CKMBINDEX, TROPONINI in the last 168 hours. BNP (last 3 results) No results for input(s): PROBNP in the last 8760 hours. HbA1C: No results for input(s): HGBA1C in the last 72 hours. CBG: Recent Labs  Lab 11/02/20 0906  GLUCAP 149*   Lipid Profile: No results for input(s): CHOL, HDL, LDLCALC, TRIG, CHOLHDL, LDLDIRECT in the last 72 hours. Thyroid Function Tests: No results for input(s): TSH, T4TOTAL, FREET4, T3FREE, THYROIDAB in the last 72 hours. Anemia Panel: No results for input(s): VITAMINB12, FOLATE, FERRITIN, TIBC, IRON, RETICCTPCT in the last 72 hours. Urine analysis:    Component Value Date/Time   COLORURINE YELLOW 11/01/2020 1701   APPEARANCEUR HAZY (A) 11/01/2020 1701   LABSPEC 1.031 (H) 11/01/2020 1701   PHURINE 5.0 11/01/2020 1701   GLUCOSEU NEGATIVE 11/01/2020 1701   HGBUR MODERATE (A) 11/01/2020 1701   BILIRUBINUR SMALL (A) 11/01/2020 1701   KETONESUR 20 (A) 11/01/2020 1701   PROTEINUR 100 (A) 11/01/2020 1701   NITRITE NEGATIVE 11/01/2020 1701   LEUKOCYTESUR NEGATIVE 11/01/2020 1701    Radiological Exams on Admission: Personally reviewed  CT ABDOMEN PELVIS W CONTRAST  Result Date: 11/04/2020 CLINICAL DATA:  Status post laparoscopic appendectomy. Persistent leukocytosis. EXAM: CT ABDOMEN AND PELVIS WITH CONTRAST TECHNIQUE: Multidetector CT imaging of the abdomen and pelvis was performed using the standard protocol following bolus administration of intravenous contrast. CONTRAST:  100mL OMNIPAQUE IOHEXOL 300 MG/ML  SOLN COMPARISON:  October 29, 2020. FINDINGS: Lower chest: Small bilateral pleural effusions. Bibasilar enhancing opacities most consistent with atelectasis. Hepatobiliary: No focal liver abnormality is seen.  No gallstones, gallbladder wall thickening, or biliary dilatation. Pancreas: Unremarkable. No pancreatic ductal dilatation or surrounding inflammatory changes. Spleen: Normal in size without focal abnormality. Adrenals/Urinary Tract: Adrenal glands are unremarkable. Kidneys are normal, without renal calculi, focal lesion, or hydronephrosis. Bladder is unremarkable. Stomach/Bowel: Status post appendectomy. Small amount of bowel wall thickening of the cecum, likely reactive. There are several rim enhancing fluid collections throughout the abdomen. There is a rim enhancing fluid collection within the upper abdomen which spans 6.9 by  3.3 cm (series 2, image 46). There is fat stranding adjacent to this collection. There is a tubular collection with a small focus of air in the RIGHT lower quadrant immediately adjacent to small bowel which spans 6.5 x 2.6 cm (series 2, image 62). There is a small collection posterior to a loop of bowel which measures 3.1 x 2.3 cm, previously 2.0 x 1.3 cm (series 2, image 54). These 3 collections may be partially contiguous. There is a small amount of fluid within the RIGHT pelvis with faint adjacent enhancement. Small amount of rounded fluid adjacent to the LEFT rectum measures 1.7 cm. There is a small amount of free fluid overlying the liver, within the LEFT hemiabdomen and overlying the spleen. JP drain is in the RIGHT lower quadrant and is not immediately adjacent to any of the described collections. There is mild dilation of the proximal small bowel. Enteric contrast has progressed to the proximal small bowel. Vascular/Lymphatic: Atherosclerotic calcifications of the aorta. No suspicious lymphadenopathy. Mildly prominent mesenteric lymph nodes are likely reactive. Reproductive: IUD is present within the uterus. Tampon. No suspicious adnexal masses. Other: Small amount of air within the anterior subcutaneous tissues consistent with surgical intervention. Musculoskeletal: Chronic mild  vertebral body height loss of L3. No acute osseous abnormality. IMPRESSION: 1. Multiple rim enhancing fluid collections throughout the abdomen and pelvis, some of which may be partially contiguous. Findings are concerning for abscesses given history of gangrenous appendicitis. 2. JP-drain is in the RIGHT lower quadrant and is not immediately adjacent to any of the above described collections. 3. Mild dilation of the proximal small bowel which is favored to reflect ileus. Enteric contrast has progressed to the proximal small bowel. 4. Small bilateral pleural effusions. Aortic Atherosclerosis (ICD10-I70.0). Electronically Signed   By: Meda Klinefelter MD   On: 11/04/2020 15:44       Assessment and Plan:   Principal Problem:   Ileus, postoperative (HCC) Active Problems:   Hypokalemia   Intra-abdominal abscess (HCC)    1.  Intra-abdominal abscesses: Continue IV antibiotics-broaden spectrum with IV Zosyn until cultures available given worsening leukocytosis.  IR drainage as planned by surgical service.  RN to notify surgical service of patient's arrival.  Hemodynamically stable, continue IV fluids (increase rate to 70 mL/h), n.p.o. status until cleared by surgical service and IV pain medications as needed.  Monitor electrolytes and replace.  Blood cultures from 12/14 - so far.  Repeat cultures if spikes temp/fever.  2.  Postoperative ileus: Being medically managed by surgical service.  Was tolerating soft diet as of yesterday.  Currently n.p.o. for anticipated procedure.  Continue IV fluids and advance diet when okay per surgery.  3.  Hypothyroidism: Resume home medications.  4.  Hypokalemia: Currently on potassium supplemented IV fluids.  5.  Hypertension/hypercholesterolemia: Resume home medications  6. Depression/anxiety/bipolar disorder: Resume home medications.  Noted that Xanax was resumed over the weekend.  No signs of withdrawal currently.  7. Small bilateral pleural  effusions/atelectasis. : Noted on CT, watch on IV fluids. Incentive spirometry given recent ileus/abdominal distension  8. Alcohol/Tobacco use: Counseled-she states she drinks alcohol occasionally, quit smoking 2 months back.  DVT prophylaxis: Lovenox after IR procedure  COVID screen: Negative from 12/15  Code Status: Full code. Mother will be [place] care proxy  Patient/Family Communication: Discussed with patient and all questions answered to satisfaction.  Consults called: General surgery to follow along.  IR consulted by GS Admission status :I certify that at the point of admission it is  my clinical judgment that the patient will require inpatient hospital care spanning beyond 2 midnights from the point of admission due to high intensity of service and high frequency of surveillance required.Inpatient status is judged to be reasonable and necessary in order to provide the required intensity of service to ensure the patient's safety. The patient's presenting symptoms, physical exam findings, and initial radiographic and laboratory data in the context of their chronic comorbidities is felt to place them at high risk for further clinical deterioration. The following factors support the patient status of inpatient : Multiple intra-abdominal abscesses with leukocytosis requiring IV antibiotics and IR drainage     Alessandra Bevels MD [facility] Pager in [place]  If 7PM-7AM, please contact night-coverage [email or web address]   11/05/2020, 4:03 PM

## 2020-11-05 NOTE — Progress Notes (Signed)
Report called to Synetta Fail RN at [facility], patient informed that she has a bed and is being transferred.

## 2020-11-06 ENCOUNTER — Inpatient Hospital Stay (HOSPITAL_COMMUNITY): Payer: Medicaid Other

## 2020-11-06 LAB — CBC WITH DIFFERENTIAL/PLATELET
Abs Immature Granulocytes: 0 10*3/uL (ref 0.00–0.07)
Basophils Absolute: 0 10*3/uL (ref 0.0–0.1)
Basophils Relative: 0 %
Eosinophils Absolute: 0.3 10*3/uL (ref 0.0–0.5)
Eosinophils Relative: 2 %
HCT: 31.4 % — ABNORMAL LOW (ref 36.0–46.0)
Hemoglobin: 10.4 g/dL — ABNORMAL LOW (ref 12.0–15.0)
Lymphocytes Relative: 17 %
Lymphs Abs: 2.7 10*3/uL (ref 0.7–4.0)
MCH: 30.8 pg (ref 26.0–34.0)
MCHC: 33.1 g/dL (ref 30.0–36.0)
MCV: 92.9 fL (ref 80.0–100.0)
Monocytes Absolute: 0.8 10*3/uL (ref 0.1–1.0)
Monocytes Relative: 5 %
Neutro Abs: 11.9 10*3/uL — ABNORMAL HIGH (ref 1.7–7.7)
Neutrophils Relative %: 76 %
Platelets: 317 10*3/uL (ref 150–400)
RBC: 3.38 MIL/uL — ABNORMAL LOW (ref 3.87–5.11)
RDW: 14.6 % (ref 11.5–15.5)
WBC: 15.6 10*3/uL — ABNORMAL HIGH (ref 4.0–10.5)
nRBC: 0 /100 WBC
nRBC: 0.1 % (ref 0.0–0.2)

## 2020-11-06 LAB — PHOSPHORUS: Phosphorus: 4 mg/dL (ref 2.5–4.6)

## 2020-11-06 LAB — BASIC METABOLIC PANEL
Anion gap: 9 (ref 5–15)
BUN: 5 mg/dL — ABNORMAL LOW (ref 6–20)
CO2: 23 mmol/L (ref 22–32)
Calcium: 8.2 mg/dL — ABNORMAL LOW (ref 8.9–10.3)
Chloride: 106 mmol/L (ref 98–111)
Creatinine, Ser: 0.99 mg/dL (ref 0.44–1.00)
GFR, Estimated: >60 mL/min (ref >60)
Glucose, Bld: 93 mg/dL (ref 70–99)
Potassium: 4 mmol/L (ref 3.5–5.1)
Sodium: 138 mmol/L (ref 135–145)

## 2020-11-06 LAB — PROTIME-INR
INR: 1.2 (ref 0.8–1.2)
Prothrombin Time: 14.5 seconds (ref 11.4–15.2)

## 2020-11-06 LAB — CULTURE, BLOOD (SINGLE): Culture: NO GROWTH

## 2020-11-06 LAB — MAGNESIUM: Magnesium: 2 mg/dL (ref 1.7–2.4)

## 2020-11-06 MED ORDER — FENTANYL CITRATE (PF) 100 MCG/2ML IJ SOLN
INTRAMUSCULAR | Status: AC | PRN
  Administered 2020-11-06: 50 ug via INTRAVENOUS

## 2020-11-06 MED ORDER — MIDAZOLAM HCL 2 MG/2ML IJ SOLN
INTRAMUSCULAR | Status: AC | PRN
Start: 2020-11-06 — End: 2020-11-06
  Administered 2020-11-06: 1 mg via INTRAVENOUS

## 2020-11-06 MED ORDER — FENTANYL CITRATE (PF) 100 MCG/2ML IJ SOLN
INTRAMUSCULAR | Status: AC
  Filled 2020-11-06: qty 4

## 2020-11-06 MED ORDER — MIDAZOLAM HCL 2 MG/2ML IJ SOLN
INTRAMUSCULAR | Status: AC
  Filled 2020-11-06: qty 4

## 2020-11-06 MED ORDER — KCL IN DEXTROSE-NACL 20-5-0.45 MEQ/L-%-% IV SOLN
INTRAVENOUS | Status: AC
  Filled 2020-11-06 (×3): qty 1000

## 2020-11-06 NOTE — Consult Note (Signed)
Chief Complaint: Patient was seen in consultation today for abdominal abscess drain placement Chief Complaint  Patient presents with  . Abdominal Pain   at the request of Dr Zenon Mayo   Supervising Physician: Ruel Favors  Patient Status: [facility] - In-pt  History of Present Illness: Evelyn Simpson is a 44 y.o. female   Lap Appendectomy 12/13 in OR with Dr Zenon Mayo-- [facility] Did well several days and then developed abd pain; and fever and leukocytosis WBC continued to rise CT 12/18: IMPRESSION: 1. Multiple rim enhancing fluid collections throughout the abdomen and pelvis, some of which may be partially contiguous. Findings are concerning for abscesses given history of gangrenous appendicitis. 2. JP-drain is in the RIGHT lower quadrant and is not immediately adjacent to any of the above described collections. 3. Mild dilation of the proximal small bowel which is favored to reflect ileus. Enteric contrast has progressed to the proximal small bowel. 4. Small bilateral pleural effusions.  Request for IR drain placement per Surgery Dr Fredia Sorrow reviewed images and approves procedure Transferred to [facility] and now scheduled for procedure today  Past Medical History:  Diagnosis Date  . ADD (attention deficit disorder)   . Anxiety   . Bipolar 1 disorder (HCC)   . Depression   . HTN (hypertension)   . Hypercholesteremia   . Hypothyroidism     Past Surgical History:  Procedure Laterality Date  . APPENDECTOMY    . CERVIX SURGERY    . COLONOSCOPY WITH PROPOFOL N/A 05/10/2019   Procedure: COLONOSCOPY WITH PROPOFOL;  Surgeon: Corbin Ade, MD;  Location: [facility] ENDO SUITE;  Service: Endoscopy;  Laterality: N/A;  2:30pm - pt does not want to move up  . HAND EXPLORATION Left    removal of glass from finger  . LAPAROSCOPIC APPENDECTOMY N/A 10/30/2020   Procedure: APPENDECTOMY LAPAROSCOPIC;  Surgeon: Franky Macho, MD;  Location: [facility] ORS;  Service: General;  Laterality: N/A;  . MANDIBLE  SURGERY      Allergies: Patient has no known allergies.  Medications: Prior to Admission medications   Medication Sig Start Date End Date Taking? Authorizing Provider  ALPRAZolam (XANAX XR) 1 MG 24 hr tablet Take 1 mg by mouth every morning.    Yes [provider]  ALPRAZolam Prudy Feeler) 1 MG tablet Take 1 tablet by mouth at bedtime as needed.   Yes [provider]  desvenlafaxine (PRISTIQ) 100 MG 24 hr tablet Take 100 mg by mouth 2 (two) times daily.    Yes [provider]  hydrOXYzine (VISTARIL) 25 MG capsule Take 25 mg by mouth 3 (three) times daily as needed.   Yes [provider]  levothyroxine (SYNTHROID) 112 MCG tablet Take 112 mcg by mouth daily before breakfast.  01/19/19  Yes [provider]  LINZESS 290 MCG CAPS capsule TAKE ONE (1) CAPSULE EACH DAY Patient taking differently: Take 290 mcg by mouth daily before breakfast. 10/08/19  Yes Gelene Mink, NP  lisinopril-hydrochlorothiazide (ZESTORETIC) 20-12.5 MG tablet Take 1 tablet by mouth daily. 10/01/17  Yes [provider]  ondansetron (ZOFRAN-ODT) 8 MG disintegrating tablet Take 8 mg by mouth 3 (three) times daily. 10/28/20  Yes [provider]  oxyCODONE-acetaminophen (PERCOCET) 5-325 MG tablet Take 1 tablet by mouth every 4 (four) hours as needed for severe pain. 10/31/20 10/31/21 Yes Franky Macho, MD  QUEtiapine (SEROQUEL) 300 MG tablet Take 600 mg by mouth at bedtime.    Yes [provider]  simvastatin (ZOCOR) 20 MG tablet Take 20  mg by mouth daily.  10/01/17  Yes [provider]  Vitamin D, Ergocalciferol, (DRISDOL) 1.25 MG (50000 UT) CAPS capsule Take 50,000 Units by mouth once a week. Saturdays 03/13/18  Yes [provider]  amoxicillin-clavulanate (AUGMENTIN) 875-125 MG tablet Take 1 tablet by mouth 2 (two) times daily. 10/31/20   Franky Macho, MD  potassium chloride 20 MEQ TBCR Take 10 mEq by mouth daily. 10/31/20   Franky Macho, MD      Family History  Problem Relation Age of Onset  . Breast cancer Mother   . Liver disease Neg Hx   . Colon cancer Neg Hx     Social History   Socioeconomic History  . Marital status: Legally Separated    Spouse name: Not on file  . Number of children: Not on file  . Years of education: Not on file  . Highest education level: Not on file  Occupational History  . Not on file  Tobacco Use  . Smoking status: Current Every Day Smoker    Packs/day: 1.00    Years: 28.00    Pack years: 28.00    Types: Cigarettes  . Smokeless tobacco: Never Used  Vaping Use  . Vaping Use: Never used  Substance and Sexual Activity  . Alcohol use: Yes  . Drug use: Not Currently  . Sexual activity: Not on file  Other Topics Concern  . Not on file  Social History Narrative  . Not on file   Social Determinants of Health   Financial Resource Strain: Not on file  Food Insecurity: Not on file  Transportation Needs: Not on file  Physical Activity: Not on file  Stress: Not on file  Social Connections: Not on file    Review of Systems: A 12 point ROS discussed and pertinent positives are indicated in the HPI above.  All other systems are negative.  Review of Systems  Constitutional: Positive for activity change, appetite change and fever.  Respiratory: Negative for cough and shortness of breath.   Cardiovascular: Negative for chest pain.  Gastrointestinal: Positive for abdominal pain and nausea.  Psychiatric/Behavioral: Negative for behavioral problems and confusion.    Vital Signs: BP (!) 144/93   Pulse 72   Temp 98.3 F (36.8 C) (Oral)   Resp 18   Ht  (1.626 m)   Wt 209 lb 14.1 oz (95.2 kg)   LMP 10/28/2020 Comment: Pregnancy Test Neg on 10/29/20  SpO2 96%   BMI 36.03 kg/m   Physical Exam Vitals reviewed.  Cardiovascular:     Rate and Rhythm: Normal rate and regular rhythm.  Pulmonary:     Effort: Pulmonary effort is normal.     Breath sounds: Normal breath sounds.   Abdominal:     Palpations: Abdomen is soft.     Tenderness: There is abdominal tenderness in the right upper quadrant and right lower quadrant.  Skin:    General: Skin is warm.  Neurological:     Mental Status: She is alert and oriented to person, place, and time.  Psychiatric:        Behavior: Behavior normal.     Imaging: DG Abd 1 View  Result Date: 11/02/2020 CLINICAL DATA:  Recent appendectomy.  Abdominal pain EXAM: ABDOMEN - 1 VIEW COMPARISON:  11/01/2020 FINDINGS: Distended small bowel loops appears slightly improved in the interval. Colon decompressed. IUD noted in the pelvis. IMPRESSION: Mild improvement in small bowel dilatation.  Colon decompressed. Electronically Signed   By: Marlan Palau M.D.  On: 11/02/2020 10:32   CT ABDOMEN PELVIS W CONTRAST  Result Date: 11/04/2020 CLINICAL DATA:  Status post laparoscopic appendectomy. Persistent leukocytosis. EXAM: CT ABDOMEN AND PELVIS WITH CONTRAST TECHNIQUE: Multidetector CT imaging of the abdomen and pelvis was performed using the standard protocol following bolus administration of intravenous contrast. CONTRAST:  OMNIPAQUE IOHEXOL 300 MG/ML  SOLN COMPARISON:  October 29, 2020. FINDINGS: Lower chest: Small bilateral pleural effusions. Bibasilar enhancing opacities most consistent with atelectasis. Hepatobiliary: No focal liver abnormality is seen. No gallstones, gallbladder wall thickening, or biliary dilatation. Pancreas: Unremarkable. No pancreatic ductal dilatation or surrounding inflammatory changes. Spleen: Normal in size without focal abnormality. Adrenals/Urinary Tract: Adrenal glands are unremarkable. Kidneys are normal, without renal calculi, focal lesion, or hydronephrosis. Bladder is unremarkable. Stomach/Bowel: Status post appendectomy. Small amount of bowel wall thickening of the cecum, likely reactive. There are several rim enhancing fluid collections throughout the abdomen. There is a rim enhancing fluid collection  within the upper abdomen which spans 6.9 by 3.3 cm (series 2, image 46). There is fat stranding adjacent to this collection. There is a tubular collection with a small focus of air in the RIGHT lower quadrant immediately adjacent to small bowel which spans 6.5 x 2.6 cm (series 2, image 62). There is a small collection posterior to a loop of bowel which measures 3.1 x 2.3 cm, previously 2.0 x 1.3 cm (series 2, image 54). These 3 collections may be partially contiguous. There is a small amount of fluid within the RIGHT pelvis with faint adjacent enhancement. Small amount of rounded fluid adjacent to the LEFT rectum measures 1.7 cm. There is a small amount of free fluid overlying the liver, within the LEFT hemiabdomen and overlying the spleen. JP drain is in the RIGHT lower quadrant and is not immediately adjacent to any of the described collections. There is mild dilation of the proximal small bowel. Enteric contrast has progressed to the proximal small bowel. Vascular/Lymphatic: Atherosclerotic calcifications of the aorta. No suspicious lymphadenopathy. Mildly prominent mesenteric lymph nodes are likely reactive. Reproductive: IUD is present within the uterus. Tampon. No suspicious adnexal masses. Other: Small amount of air within the anterior subcutaneous tissues consistent with surgical intervention. Musculoskeletal: Chronic mild vertebral body height loss of L3. No acute osseous abnormality. IMPRESSION: 1. Multiple rim enhancing fluid collections throughout the abdomen and pelvis, some of which may be partially contiguous. Findings are concerning for abscesses given history of gangrenous appendicitis. 2. JP-drain is in the RIGHT lower quadrant and is not immediately adjacent to any of the above described collections. 3. Mild dilation of the proximal small bowel which is favored to reflect ileus. Enteric contrast has progressed to the proximal small bowel. 4. Small bilateral pleural effusions. Aortic  Atherosclerosis (ICD10-I70.0). Electronically Signed   By: Meda Klinefelter MD   On: 11/04/2020 15:44   CT Abdomen Pelvis W Contrast  Result Date: 10/29/2020 CLINICAL DATA:  Acute abdominal pain with nausea EXAM: CT ABDOMEN AND PELVIS WITH CONTRAST TECHNIQUE: Multidetector CT imaging of the abdomen and pelvis was performed using the standard protocol following bolus administration of intravenous contrast. CONTRAST:  OMNIPAQUE IOHEXOL 300 MG/ML  SOLN COMPARISON:  None. FINDINGS: Lower chest: Bibasilar and lingula atelectasis. No pleural or pericardial effusion. Normal heart size. Hepatobiliary: No focal hepatic abnormality or biliary dilatation. Hepatic and portal veins are patent. Gallbladder and biliary tree unremarkable. Common bile duct nondilated. Trace perihepatic fluid along the right inferior liver. Pancreas: Unremarkable. No pancreatic ductal dilatation or surrounding inflammatory changes. Spleen:  No focal abnormality. Normal size. Trace left upper quadrant subdiaphragmatic and perisplenic free fluid. Adrenals/Urinary Tract: Normal adrenal glands. Small bilateral subcentimeter renal cysts. No renal obstruction or hydronephrosis. No hydroureter or ureteral calculus. Bladder unremarkable. Stomach/Bowel: Negative for bowel obstruction, significant dilatation, or free air. In the right lower quadrant, the appendix is dilated with fluid distension, mucosal enhancement and small radiodense appendicoliths compatible with appendicitis. Additionally, several loops of mid and distal small bowel demonstrate slight wall thickening and mucosal enhancement. Similar wall thickening and enhancement of the cecum. Central mesentery demonstrates diffuse edema/inflammation with a few scattered small areas of mesenteric interloop free fluid. This remains nonspecific but suggest a secondary associated enterocolitis/peritonitis pattern. Vascular/Lymphatic: Intact aorta. Infrarenal atherosclerotic change. No aneurysm  or occlusive process. No retroperitoneal hemorrhage or hematoma. Mesenteric and renal vasculature appear to remain patent. No veno-occlusive process. No bulky adenopathy. Reproductive: IUD within the midline of the uterus endometrial cavity. Uterus and adnexa normal in size. Trace pelvic free fluid as well. Other: Intact abdominal wall. Musculoskeletal: Degenerative changes of the spine. Lower lumbar facet arthropathy. No acute osseous finding. IMPRESSION: Acute appendicitis with associated appendicoliths. Diffuse mid and distal small bowel and cecal wall thickening with mucosal enhancement as well as central mesentery edema and scattered areas of mesenteric interloop free fluid. Appearance is compatible with associated enterocolitis/peritonitis. No discernible abscess, free air or evidence of perforation. These results were called by telephone at the time of interpretation on 10/29/2020 at 2:27 pm to provider [facility] , who verbally acknowledged these results. Electronically Signed   By: Judie Petit.  Shick M.D.   On: 10/29/2020 14:29   DG Chest Port 1 View  Result Date: 10/31/2020 CLINICAL DATA:  Fever. Post recent appendectomy. EXAM: PORTABLE CHEST 1 VIEW COMPARISON:  None. FINDINGS: Lung volumes are low. Patchy bibasilar opacities favoring atelectasis. There may be a small left pleural effusion. No pneumothorax. The heart is normal in size with normal mediastinal contours. No acute osseous abnormalities are seen. IMPRESSION: 1. Low lung volumes with patchy bibasilar opacities, favoring atelectasis. 2. Possible small left pleural effusion. Electronically Signed   By: Narda Rutherford M.D.   On: 10/31/2020 23:13   DG Abd Portable 2 Views  Result Date: 11/01/2020 CLINICAL DATA:  Vomiting following appendectomy EXAM: PORTABLE ABDOMEN - 2 VIEW COMPARISON:  None. FINDINGS: Two view radiograph of the abdomen demonstrates multiple gas-filled mildly dilated loops of small bowel within the mid abdomen demonstrating  air-fluid levels at similar levels on upright examination most in keeping with a postoperative adynamic ileus. No free intraperitoneal gas. Surgical drainage catheter noted within the right hemipelvis. Intrauterine device in place. Mild lumbar levoscoliosis again noted IMPRESSION: Multiple gas-filled dilated loops of bowel within the mid abdomen most in keeping with an ileus. Electronically Signed   By: Helyn Numbers MD   On: 11/01/2020 02:14    Labs:  CBC: Recent Labs    11/03/20 0547 11/04/20 0451 11/05/20 0442 11/06/20 0101  WBC 13.3* 15.3* 17.2* 15.6*  HGB 11.1* 11.4* 10.8* 10.4*  HCT 33.4* 34.3* 32.5* 31.4*  PLT 258 274 300 317    COAGS: No results for input(s): INR, APTT in the last 8760 hours.  BMP: Recent Labs    11/03/20 0547 11/04/20 0451 11/05/20 0442 11/06/20 0101  NA 135 137 136 138  K 2.9* 3.0* 3.5 4.0  CL 96* 100 104 106  CO2 28 28 27 23   GLUCOSE 104* 91 98 93  BUN 13 6 <5* 5*  CALCIUM 7.9* 8.0* 7.8* 8.2*  CREATININE 0.92 0.72 0.81 0.99  GFRNONAA >60 >60 >60 >60    LIVER FUNCTION TESTS: Recent Labs    10/29/20 1227 10/31/20 2317  BILITOT 1.2 2.3*  AST 26 33  ALT 32 20  ALKPHOS 76 74  PROT 8.1 6.9  ALBUMIN 4.0 3.0*    TUMOR MARKERS: No results for input(s): AFPTM, CEA, CA199, CHROMGRNA in the last 8760 hours.  Assessment and Plan:  Post lap appendectomy 12/13 Fever; abd pain and leukocytosis Imaging revealing intrabdominal abscesses Now scheduled for abdominal abscess drain placement in IR Risks and benefits discussed with the patient including bleeding, infection, damage to adjacent structures, bowel perforation/fistula connection, and sepsis.  All of the patient's questions were answered, patient is agreeable to proceed. Consent signed and in chart.   Thank you for this interesting consult.  I greatly enjoyed meeting Evelyn Simpson and look forward to participating in their care.  A copy of this report was sent to the requesting  provider on this date.  Electronically Signed: Robet LeuPamela A Jakalyn Kratky, PA-C 11/06/2020, 9:46 AM   I spent a total of 40 Minutes    in face to face in clinical consultation, greater than 50% of which was counseling/coordinating care for abdominal abscess drain

## 2020-11-06 NOTE — Procedures (Signed)
Interventional Radiology Procedure Note  Procedure: CT DRAIN RT ABDOMINAL ABSCESS    Complications: None  Estimated Blood Loss:  MIN  Findings: 60CC PUS CX SENT    Sharen Counter, MD

## 2020-11-06 NOTE — Progress Notes (Signed)
PROGRESS NOTE   Evelyn Simpson  PYY:[id number] DOB: 08-Aug-1976 DOA: 10/31/2020 PCP: Rebecka Apley, NP  Brief Narrative:  44 year old white female Bipolar, HTN, hypothyroid, HLD Status post lap appendectomy drain placement 1213 discharged home Developed postop ileus and returned same day Because of increasing leukocytosis-it was felt she may have advancing infection CT performed showed multiple rim-enhancing fluid collections JP drain right lower quadrant General surgery Jeani Hawking discussed with IR planning-patient was kept n.p.o. placed on IV cefotetan which was broadened to Zosyn Currently awaiting IR and put   Assessment & Plan:   Principal Problem:   Ileus, postoperative (HCC) Active Problems:   Hypokalemia   Intra-abdominal abscess (HCC)   1. Multiloculated abscesses in abdomen a. Repeat CT scan 12/18 shows a small collection posterior to bowel loop 3 x 1.2 cm and another 6.9 x 3.3 cm-JP drain not in place where these abscesses are b. Going for further drain placement by IR today c. We will get nonemergent general surgery input depending on repeat labs and clinical course progression d. Previously on cefotetan-currently continue Zosyn e. Continue IV fluids D5 half-normal with 20 of K and keep n.p.o. till procedure f. May be able to advance diet after procedure 2. Status post appendectomy drain placement 12/13 Dr. Lovell Sheehan a. See above discussion 3. Bipolar a. Continue Seroquel 600 at bedtime b. Continue Pristiq 150 daily a.m. c. Hydroxyzine, Ativan on hold 4. HTN a. Holding lisinopril HCTZ at this time 5. HLD a. Holding Zocor 20 daily 6. Hypothyroid a. Continue levothyroxine 112 q. a.m. and monitor trends  DVT prophylaxis: Lovenox Code Status: Full Family Communication: None Disposition:  Status is: Inpatient  Remains inpatient appropriate because:Hemodynamically unstable, Persistent severe electrolyte disturbances and Ongoing diagnostic testing needed not  appropriate for outpatient work up   Dispo: The patient is from: Home              Anticipated d/c is to: Home              Anticipated d/c date is: 3 days              Patient currently is not medically stable to d/c.       Consultants:   IR  Procedures: None  Antimicrobials: Currently IV Zosyn   Subjective: Fair somewhat comfortable some pain in abdomen Passing some flatus No fever no chills Has not vomited today  Objective: Vitals:   11/05/20 1420 11/05/20 1942 11/05/20 2353 11/06/20 0319  BP: 126/85 (!) 146/85 132/84 (!) 144/89  Pulse: 78 73 70 76  Resp: 16 16 16 16   Temp: 97.6 F (36.4 C) 98 F (36.7 C) (!) 97.5 F (36.4 C) 97.7 F (36.5 C)  TempSrc: Oral Oral Oral Oral  SpO2: 98% 98% 99% 99%  Weight:      Height:        Intake/Output Summary (Last 24 hours) at 11/06/2020 11/08/2020 Last data filed at 11/06/2020 0400 Gross per 24 hour  Intake 2471.12 ml  Output 30 ml  Net 2441.12 ml   Filed Weights   10/31/20 2203 11/02/20 0940  Weight: 97.5 kg 95.2 kg    Examination:  EOMI NCAT no focal deficit Chest clear Abdomen soft slightly distended tender and epigastric and lower quadrant-drain is in left side of left lower quadrant S1-S2 no murmur Slightly anxious affect  Data Reviewed: I have personally reviewed following labs and imaging studies White count 17.2->15.6 BUNs/creatinine 5/0.99   Radiology Studies: CT ABDOMEN PELVIS W CONTRAST  Result Date:  11/04/2020 CLINICAL DATA:  Status post laparoscopic appendectomy. Persistent leukocytosis. EXAM: CT ABDOMEN AND PELVIS WITH CONTRAST TECHNIQUE: Multidetector CT imaging of the abdomen and pelvis was performed using the standard protocol following bolus administration of intravenous contrast. CONTRAST:  OMNIPAQUE IOHEXOL 300 MG/ML  SOLN COMPARISON:  October 29, 2020. FINDINGS: Lower chest: Small bilateral pleural effusions. Bibasilar enhancing opacities most consistent with atelectasis.  Hepatobiliary: No focal liver abnormality is seen. No gallstones, gallbladder wall thickening, or biliary dilatation. Pancreas: Unremarkable. No pancreatic ductal dilatation or surrounding inflammatory changes. Spleen: Normal in size without focal abnormality. Adrenals/Urinary Tract: Adrenal glands are unremarkable. Kidneys are normal, without renal calculi, focal lesion, or hydronephrosis. Bladder is unremarkable. Stomach/Bowel: Status post appendectomy. Small amount of bowel wall thickening of the cecum, likely reactive. There are several rim enhancing fluid collections throughout the abdomen. There is a rim enhancing fluid collection within the upper abdomen which spans 6.9 by 3.3 cm (series 2, image 46). There is fat stranding adjacent to this collection. There is a tubular collection with a small focus of air in the RIGHT lower quadrant immediately adjacent to small bowel which spans 6.5 x 2.6 cm (series 2, image 62). There is a small collection posterior to a loop of bowel which measures 3.1 x 2.3 cm, previously 2.0 x 1.3 cm (series 2, image 54). These 3 collections may be partially contiguous. There is a small amount of fluid within the RIGHT pelvis with faint adjacent enhancement. Small amount of rounded fluid adjacent to the LEFT rectum measures 1.7 cm. There is a small amount of free fluid overlying the liver, within the LEFT hemiabdomen and overlying the spleen. JP drain is in the RIGHT lower quadrant and is not immediately adjacent to any of the described collections. There is mild dilation of the proximal small bowel. Enteric contrast has progressed to the proximal small bowel. Vascular/Lymphatic: Atherosclerotic calcifications of the aorta. No suspicious lymphadenopathy. Mildly prominent mesenteric lymph nodes are likely reactive. Reproductive: IUD is present within the uterus. Tampon. No suspicious adnexal masses. Other: Small amount of air within the anterior subcutaneous tissues consistent with  surgical intervention. Musculoskeletal: Chronic mild vertebral body height loss of L3. No acute osseous abnormality. IMPRESSION: 1. Multiple rim enhancing fluid collections throughout the abdomen and pelvis, some of which may be partially contiguous. Findings are concerning for abscesses given history of gangrenous appendicitis. 2. JP-drain is in the RIGHT lower quadrant and is not immediately adjacent to any of the above described collections. 3. Mild dilation of the proximal small bowel which is favored to reflect ileus. Enteric contrast has progressed to the proximal small bowel. 4. Small bilateral pleural effusions. Aortic Atherosclerosis (ICD10-I70.0). Electronically Signed   By: Meda Klinefelter MD   On: 11/04/2020 15:44     Scheduled Meds:  ALPRAZolam  0.5 mg Oral BID   bisacodyl  10 mg Rectal Daily   docusate sodium  100 mg Oral BID   [START ON 11/07/2020] enoxaparin (LOVENOX) injection  40 mg Subcutaneous Q24H   levothyroxine  112 mcg Oral QAC breakfast   pantoprazole (PROTONIX) IV  40 mg Intravenous QHS   QUEtiapine  600 mg Oral QHS   venlafaxine XR  150 mg Oral Q breakfast   Continuous Infusions:  dextrose 5 % and 0.45 % NaCl with KCl 20 mEq/L     piperacillin-tazobactam (ZOSYN)  IV 3.375 g (11/06/20 0525)     LOS: 5 days    Time spent: 30  Rhetta Mura, MD [facility] To contact  the attending provider between 7A-7P or the covering provider during after hours 7P-7A, please log into the web site [email or web address] and access using universal [place] password for that web site. If you do not have the password, please call the hospital operator.  11/06/2020, 8:06 AM

## 2020-11-06 NOTE — Plan of Care (Signed)

## 2020-11-07 ENCOUNTER — Ambulatory Visit: Payer: Medicaid Other | Admitting: General Surgery

## 2020-11-07 LAB — COMPREHENSIVE METABOLIC PANEL
ALT: 14 U/L (ref 0–44)
AST: 12 U/L — ABNORMAL LOW (ref 15–41)
Albumin: 2 g/dL — ABNORMAL LOW (ref 3.5–5.0)
Alkaline Phosphatase: 66 U/L (ref 38–126)
Anion gap: 10 (ref 5–15)
BUN: 5 mg/dL — ABNORMAL LOW (ref 6–20)
CO2: 22 mmol/L (ref 22–32)
Calcium: 8 mg/dL — ABNORMAL LOW (ref 8.9–10.3)
Chloride: 105 mmol/L (ref 98–111)
Creatinine, Ser: 0.82 mg/dL (ref 0.44–1.00)
GFR, Estimated: >60 mL/min (ref >60)
Glucose, Bld: 101 mg/dL — ABNORMAL HIGH (ref 70–99)
Potassium: 3.5 mmol/L (ref 3.5–5.1)
Sodium: 137 mmol/L (ref 135–145)
Total Bilirubin: 0.4 mg/dL (ref 0.3–1.2)
Total Protein: 6.2 g/dL — ABNORMAL LOW (ref 6.5–8.1)

## 2020-11-07 LAB — CBC WITH DIFFERENTIAL/PLATELET
Abs Immature Granulocytes: 1.16 10*3/uL — ABNORMAL HIGH (ref 0.00–0.07)
Basophils Absolute: 0.1 10*3/uL (ref 0.0–0.1)
Basophils Relative: 0 %
Eosinophils Absolute: 0.2 10*3/uL (ref 0.0–0.5)
Eosinophils Relative: 1 %
HCT: 31.2 % — ABNORMAL LOW (ref 36.0–46.0)
Hemoglobin: 10.5 g/dL — ABNORMAL LOW (ref 12.0–15.0)
Immature Granulocytes: 8 %
Lymphocytes Relative: 13 %
Lymphs Abs: 2 10*3/uL (ref 0.7–4.0)
MCH: 30.9 pg (ref 26.0–34.0)
MCHC: 33.7 g/dL (ref 30.0–36.0)
MCV: 91.8 fL (ref 80.0–100.0)
Monocytes Absolute: 0.5 10*3/uL (ref 0.1–1.0)
Monocytes Relative: 4 %
Neutro Abs: 11.1 10*3/uL — ABNORMAL HIGH (ref 1.7–7.7)
Neutrophils Relative %: 74 %
Platelets: 357 10*3/uL (ref 150–400)
RBC: 3.4 MIL/uL — ABNORMAL LOW (ref 3.87–5.11)
RDW: 14.6 % (ref 11.5–15.5)
WBC: 15 10*3/uL — ABNORMAL HIGH (ref 4.0–10.5)
nRBC: 0 % (ref 0.0–0.2)

## 2020-11-07 LAB — MAGNESIUM: Magnesium: 1.8 mg/dL (ref 1.7–2.4)

## 2020-11-07 LAB — PHOSPHORUS: Phosphorus: 4.8 mg/dL — ABNORMAL HIGH (ref 2.5–4.6)

## 2020-11-07 NOTE — Progress Notes (Signed)
Referring Physician(s): Dr. Zenon Mayo  Supervising Physician: Suttle  Patient Status:  [facility] - In-pt  Chief Complaint:  F/U Drain placement  Brief History:  Evelyn Simpson is a 44 y.o. female who underwent a Laparoscopic Appendectomy on 10/30/20 by Dr Zenon Mayo at [facility]. JP drain was left in place at the time of surgery.  She did well for several days but then developed abd pain, nausea and vomiting, fever and leukocytosis  CT 12/18: IMPRESSION: 1. Multiple rim enhancing fluid collections throughout the abdomen and pelvis, some of which may be partially contiguous. Findings are concerning for abscesses given history of gangrenous appendicitis. 2. JP-drain is in the RIGHT lower quadrant and is not immediately adjacent to any of the above described collections.  She was transferred to [facility] for image guided drain placement which was done by Dr. Miles Costain on 11/06/20.  Subjective:  Feeling better today. No complaints.  Allergies: Patient has no known allergies.  Medications: Prior to Admission medications   Medication Sig Start Date End Date Taking? Authorizing Provider  ALPRAZolam (XANAX XR) 1 MG 24 hr tablet Take 1 mg by mouth every morning.    Yes [provider]  ALPRAZolam Prudy Feeler) 1 MG tablet Take 1 tablet by mouth at bedtime as needed.   Yes [provider]  desvenlafaxine (PRISTIQ) 100 MG 24 hr tablet Take 100 mg by mouth 2 (two) times daily.    Yes [provider]  hydrOXYzine (VISTARIL) 25 MG capsule Take 25 mg by mouth 3 (three) times daily as needed.   Yes [provider]  levothyroxine (SYNTHROID) 112 MCG tablet Take 112 mcg by mouth daily before breakfast.  01/19/19  Yes [provider]  LINZESS 290 MCG CAPS capsule TAKE ONE (1) CAPSULE EACH DAY Patient taking differently: Take 290 mcg by mouth daily before breakfast. 10/08/19  Yes Gelene Mink, NP  lisinopril-hydrochlorothiazide (ZESTORETIC) 20-12.5 MG  tablet Take 1 tablet by mouth daily. 10/01/17  Yes [provider]  ondansetron (ZOFRAN-ODT) 8 MG disintegrating tablet Take 8 mg by mouth 3 (three) times daily. 10/28/20  Yes [provider]  oxyCODONE-acetaminophen (PERCOCET) 5-325 MG tablet Take 1 tablet by mouth every 4 (four) hours as needed for severe pain. 10/31/20 10/31/21 Yes Franky Macho, MD  QUEtiapine (SEROQUEL) 300 MG tablet Take 600 mg by mouth at bedtime.    Yes [provider]  simvastatin (ZOCOR) 20 MG tablet Take 20 mg by mouth daily.  10/01/17  Yes [provider]  Vitamin D, Ergocalciferol, (DRISDOL) 1.25 MG (50000 UT) CAPS capsule Take 50,000 Units by mouth once a week. Saturdays 03/13/18  Yes [provider]  amoxicillin-clavulanate (AUGMENTIN) 875-125 MG tablet Take 1 tablet by mouth 2 (two) times daily. 10/31/20   Franky Macho, MD  potassium chloride 20 MEQ TBCR Take 10 mEq by mouth daily. 10/31/20   Franky Macho, MD     Vital Signs: BP (!) 145/90   Pulse 83   Temp 99.2 F (37.3 C) (Oral)   Resp 17   Ht 5\' 4"  (1.626 m)   Wt 95.2 kg   LMP 10/28/2020 Comment: Pregnancy Test Neg on 10/29/20  SpO2 97%   BMI 36.03 kg/m   Physical Exam Vitals reviewed.  Constitutional:      Appearance: Normal appearance.  Cardiovascular:     Rate and Rhythm: Normal rate.  Pulmonary:     Effort: Pulmonary effort is normal. No respiratory distress.  Abdominal:     Palpations:  Abdomen is soft.     Comments: RLQ drain in place. ~120 mL output recorded, about 60 mL milky pink fluid in bulb. Flushes easily.  Neurological:     General: No focal deficit present.     Mental Status: She is alert and oriented to person, place, and time.  Psychiatric:        Mood and Affect: Mood normal.        Behavior: Behavior normal.        Thought Content: Thought content normal.        Judgment: Judgment normal.     Imaging: CT ABDOMEN PELVIS W CONTRAST  Result Date: 11/04/2020 CLINICAL DATA:   Status post laparoscopic appendectomy. Persistent leukocytosis. EXAM: CT ABDOMEN AND PELVIS WITH CONTRAST TECHNIQUE: Multidetector CT imaging of the abdomen and pelvis was performed using the standard protocol following bolus administration of intravenous contrast. CONTRAST:  OMNIPAQUE IOHEXOL 300 MG/ML  SOLN COMPARISON:  October 29, 2020. FINDINGS: Lower chest: Small bilateral pleural effusions. Bibasilar enhancing opacities most consistent with atelectasis. Hepatobiliary: No focal liver abnormality is seen. No gallstones, gallbladder wall thickening, or biliary dilatation. Pancreas: Unremarkable. No pancreatic ductal dilatation or surrounding inflammatory changes. Spleen: Normal in size without focal abnormality. Adrenals/Urinary Tract: Adrenal glands are unremarkable. Kidneys are normal, without renal calculi, focal lesion, or hydronephrosis. Bladder is unremarkable. Stomach/Bowel: Status post appendectomy. Small amount of bowel wall thickening of the cecum, likely reactive. There are several rim enhancing fluid collections throughout the abdomen. There is a rim enhancing fluid collection within the upper abdomen which spans 6.9 by 3.3 cm (series 2, image 46). There is fat stranding adjacent to this collection. There is a tubular collection with a small focus of air in the RIGHT lower quadrant immediately adjacent to small bowel which spans 6.5 x 2.6 cm (series 2, image 62). There is a small collection posterior to a loop of bowel which measures 3.1 x 2.3 cm, previously 2.0 x 1.3 cm (series 2, image 54). These 3 collections may be partially contiguous. There is a small amount of fluid within the RIGHT pelvis with faint adjacent enhancement. Small amount of rounded fluid adjacent to the LEFT rectum measures 1.7 cm. There is a small amount of free fluid overlying the liver, within the LEFT hemiabdomen and overlying the spleen. JP drain is in the RIGHT lower quadrant and is not immediately adjacent to any of  the described collections. There is mild dilation of the proximal small bowel. Enteric contrast has progressed to the proximal small bowel. Vascular/Lymphatic: Atherosclerotic calcifications of the aorta. No suspicious lymphadenopathy. Mildly prominent mesenteric lymph nodes are likely reactive. Reproductive: IUD is present within the uterus. Tampon. No suspicious adnexal masses. Other: Small amount of air within the anterior subcutaneous tissues consistent with surgical intervention. Musculoskeletal: Chronic mild vertebral body height loss of L3. No acute osseous abnormality. IMPRESSION: 1. Multiple rim enhancing fluid collections throughout the abdomen and pelvis, some of which may be partially contiguous. Findings are concerning for abscesses given history of gangrenous appendicitis. 2. JP-drain is in the RIGHT lower quadrant and is not immediately adjacent to any of the above described collections. 3. Mild dilation of the proximal small bowel which is favored to reflect ileus. Enteric contrast has progressed to the proximal small bowel. 4. Small bilateral pleural effusions. Aortic Atherosclerosis (ICD10-I70.0). Electronically Signed   By: Meda Klinefelter MD   On: 11/04/2020 15:44   CT IMAGE GUIDED DRAINAGE BY PERCUTANEOUS CATHETER  Result Date: 11/06/2020 INDICATION: Postop appendectomy, right  abdominal mesenteric abscess EXAM: CT GUIDED DRAINAGE OF THE RIGHT ABDOMINAL MESENTERIC ABSCESS MEDICATIONS: The patient is currently admitted to the hospital and receiving intravenous antibiotics. The antibiotics were administered within an appropriate time frame prior to the initiation of the procedure. ANESTHESIA/SEDATION: Fentanyl 50 mcg IV; Versed 1.0 mg IV Moderate Sedation Time:  13 MINUTES The patient was continuously monitored during the procedure by the interventional radiology nurse under my direct supervision. COMPLICATIONS: None immediate. PROCEDURE: Informed written consent was obtained from the  patient after a thorough discussion of the procedural risks, benefits and alternatives. All questions were addressed. Maximal Sterile Barrier Technique was utilized including caps, mask, sterile gowns, sterile gloves, sterile drape, hand hygiene and skin antiseptic. A timeout was performed prior to the initiation of the procedure. Previous imaging reviewed. Patient positioned supine. Noncontrast localization CT performed. The right abdominal mesenteric enlarging abscess was localized and marked for a lateral approach. Under sterile conditions and local anesthesia, an 18 gauge 15 cm access needle was advanced percutaneously into the abscess. Needle position confirmed with CT. Syringe aspiration yielded purulent fluid. Guidewire inserted followed by tract dilatation insert a 10 French drain. Drain catheter position confirmed with CT. 60 cc purulent fluid aspirated. Sample sent for culture. Catheter secured with a Prolene suture and connected to external suction bulb. Sterile dressing applied. No immediate complication. Patient tolerated the procedure well. IMPRESSION: Successful CT-guided right abdominal mesenteric abscess drain placement. Electronically Signed   By: Judie Petit.  Shick M.D.   On: 11/06/2020 16:45    Labs:  CBC: Recent Labs    11/04/20 0451 11/05/20 0442 11/06/20 0101 11/07/20 0432  WBC 15.3* 17.2* 15.6* 15.0*  HGB 11.4* 10.8* 10.4* 10.5*  HCT 34.3* 32.5* 31.4* 31.2*  PLT 274 300 317 357    COAGS: Recent Labs    11/06/20 1025  INR 1.2    BMP: Recent Labs    11/04/20 0451 11/05/20 0442 11/06/20 0101 11/07/20 0432  NA 137 136 138 137  K 3.0* 3.5 4.0 3.5  CL 100 104 106 105  CO2 28 27 23 22   GLUCOSE 91 98 93 101*  BUN 6 <5* 5* 5*  CALCIUM 8.0* 7.8* 8.2* 8.0*  CREATININE 0.72 0.81 0.99 0.82  GFRNONAA >60 >60 >60 >60    LIVER FUNCTION TESTS: Recent Labs    10/29/20 1227 10/31/20 2317 11/07/20 0432  BILITOT 1.2 2.3* 0.4  AST 26 33 12*  ALT 32 20 14  ALKPHOS 76 74 66   PROT 8.1 6.9 6.2*  ALBUMIN 4.0 3.0* 2.0*    Assessment and Plan:  S/P Lap Appy and JP drain placement at [facility] 10/30/20.  RLQ abscess on CT, s/p drain by Dr. 11/01/20 11/06/20.  Continue routine drain care with flushes. Empty and record output.  When output 10-15 mL per day and more clear, repeat CT scan.  Electronically Signed: 11/08/20, PA-C 11/07/2020, 1:56 PM    I spent a total of 15 Minutes at the the patient's bedside AND on the patient's hospital floor or unit, greater than 50% of which was counseling/coordinating care for drain follow up.

## 2020-11-07 NOTE — Plan of Care (Signed)

## 2020-11-07 NOTE — Progress Notes (Addendum)
PROGRESS NOTE   Evelyn Simpson  JSE:[id number] DOB: 05-Mar-1976 DOA: 10/31/2020 PCP: Rebecka Apley, NP  Brief Narrative:  44 year old white female Bipolar, HTN, hypothyroid, HLD Status post lap appendectomy drain placement 1213 discharged home Developed postop ileus and returned same day Because of increasing leukocytosis-it was felt she may have advancing infection CT performed showed multiple rim-enhancing fluid collections JP drain right lower quadrant General surgery Jeani Hawking discussed with IR planning-patient was kept n.p.o. placed on IV cefotetan which was broadened to Zosyn Currently awaiting IR input who performed CT guided abscess drain and general surgery was consulted   Assessment & Plan:   Principal Problem:   Ileus, postoperative (HCC) Active Problems:   Hypokalemia   Intra-abdominal abscess (HCC)   1. Multiloculated abscesses in abdomen a. Repeat CT scan 12/18 shows a small collection posterior to bowel loop 3 x 1.2 cm and another 6.9 x 3.3 cm-JP drain not in place where these abscesses are b. CT guided drain placed 12/20 with 60 cc of half cultures pending c. Previously on cefotetan-currently continue Zosyn d. Continue fluids +70 cc an hour with 20 of K and keep n.p.o. till procedure e. Diet as per general surgery f. With leukocytosis and low-grade temperature 99.2 would probably keep on IV antibiotics until we can get repeat cultures as abscess fluid showing gram-negative and gram-positive cocci 2. Status post appendectomy drain placement 12/13 Dr. Lovell Sheehan a. See above discussion 3. Bipolar a. Continue Seroquel 600 at bedtime b. Continue Pristiq 150 daily a.m. c. Hydroxyzine, Ativan on hold 4. HTN a. Holding lisinopril HCTZ at this time 5. HLD a. Holding Zocor 20 daily 6. Hypothyroid a. Continue levothyroxine 112 q. a.m. and monitor trends  DVT prophylaxis: Lovenox Code Status: Full Family Communication: None Disposition:  Status is:  Inpatient  Remains inpatient appropriate because:Hemodynamically unstable, Persistent severe electrolyte disturbances and Ongoing diagnostic testing needed not appropriate for outpatient work up   Dispo: The patient is from: Home              Anticipated d/c is to: Home              Anticipated d/c date is: 3 days              Patient currently is not medically stable to d/c.       Consultants:   IR  Procedures: None  Antimicrobials: Currently IV Zosyn   Subjective: No complaints no fever no chills Abdominal pain seems okay no chest pain Asking about home  Objective: Vitals:   11/06/20 1621 11/06/20 1951 11/07/20 0400 11/07/20 0808  BP: (!) 148/88 (!) 153/94 132/72 (!) 145/90  Pulse: 69 75 83 83  Resp: 19 16 18 17   Temp: 98 F (36.7 C) 97.9 F (36.6 C) 98 F (36.7 C) 99.2 F (37.3 C)  TempSrc: Oral Oral Oral Oral  SpO2: 99% 100% 96% 97%  Weight:      Height:        Intake/Output Summary (Last 24 hours) at 11/07/2020 1017 Last data filed at 11/07/2020 11/09/2020 Gross per 24 hour  Intake 832.24 ml  Output 80 ml  Net 752.24 ml   Filed Weights   10/31/20 2203 11/02/20 0940  Weight: 97.5 kg 95.2 kg    Examination:  EOMI NCAT no focal deficit Chest clear Abdomen soft slightly distended tender and epigastric and lower quadrant-drain is in left side of left lower quadrant also has now a drain in right upper quadrant S1-S2 no murmur Slightly anxious  affect  Data Reviewed: I have personally reviewed following labs and imaging studies White count 17.2->15.6-->15.0 Hemoglobin 10.5 BUNs/creatinine 5/ 0.82 LFTs low   Radiology Studies: CT IMAGE GUIDED DRAINAGE BY PERCUTANEOUS CATHETER  Result Date: 11/06/2020 INDICATION: Postop appendectomy, right abdominal mesenteric abscess EXAM: CT GUIDED DRAINAGE OF THE RIGHT ABDOMINAL MESENTERIC ABSCESS MEDICATIONS: The patient is currently admitted to the hospital and receiving intravenous antibiotics. The antibiotics  were administered within an appropriate time frame prior to the initiation of the procedure. ANESTHESIA/SEDATION: Fentanyl 50 mcg IV; Versed 1.0 mg IV Moderate Sedation Time:  13 MINUTES The patient was continuously monitored during the procedure by the interventional radiology nurse under my direct supervision. COMPLICATIONS: None immediate. PROCEDURE: Informed written consent was obtained from the patient after a thorough discussion of the procedural risks, benefits and alternatives. All questions were addressed. Maximal Sterile Barrier Technique was utilized including caps, mask, sterile gowns, sterile gloves, sterile drape, hand hygiene and skin antiseptic. A timeout was performed prior to the initiation of the procedure. Previous imaging reviewed. Patient positioned supine. Noncontrast localization CT performed. The right abdominal mesenteric enlarging abscess was localized and marked for a lateral approach. Under sterile conditions and local anesthesia, an 18 gauge 15 cm access needle was advanced percutaneously into the abscess. Needle position confirmed with CT. Syringe aspiration yielded purulent fluid. Guidewire inserted followed by tract dilatation insert a 10 French drain. Drain catheter position confirmed with CT. 60 cc purulent fluid aspirated. Sample sent for culture. Catheter secured with a Prolene suture and connected to external suction bulb. Sterile dressing applied. No immediate complication. Patient tolerated the procedure well. IMPRESSION: Successful CT-guided right abdominal mesenteric abscess drain placement. Electronically Signed   By: Judie Petit.  Shick M.D.   On: 11/06/2020 16:45     Scheduled Meds: . ALPRAZolam  0.5 mg Oral BID  . bisacodyl  10 mg Rectal Daily  . docusate sodium  100 mg Oral BID  . enoxaparin (LOVENOX) injection  40 mg Subcutaneous Q24H  . levothyroxine  112 mcg Oral QAC breakfast  . pantoprazole (PROTONIX) IV  40 mg Intravenous QHS  . QUEtiapine  600 mg Oral QHS  .  venlafaxine XR  150 mg Oral Q breakfast   Continuous Infusions: . dextrose 5 % and 0.45 % NaCl with KCl 20 mEq/L 70 mL/hr at 11/06/20 1127  . piperacillin-tazobactam (ZOSYN)  IV 3.375 g (11/07/20 0621)     LOS: 6 days    Time spent: 30  Rhetta Mura, MD [facility] To contact the attending provider between 7A-7P or the covering provider during after hours 7P-7A, please log into the web site [email or web address] and access using universal [place] password for that web site. If you do not have the password, please call the hospital operator.  11/07/2020, 10:17 AM

## 2020-11-07 NOTE — Consult Note (Addendum)
[facility] Surgery Consult Note  Liridona Mashaw [birth year]  [id number].    Requesting MD: Pleas Koch Chief Complaint/Reason for Consult: perforated appendicitis s/p laparoscopic appendectomy with abscess  HPI:  Redith Drach is a 44yo female PMH HTN, HLD, hypothyroidism, obesity BMI 36, bipolar, depression, anxiety who was recently admitted to [facility] with appendicitis.   She was taken for laparoscopic appendectomy 10/30/20 by Dr. Lovell Sheehan where she was found to have perforated gangrenous appendicitis. JP drain left intraoperatively. She was discharged home on POD#1 with Augmentin. She returned to the ED POD#2 with n/v and was readmitted to [facility] with ileus. Started on IV cefotetan 12/15. CT abdomen/pelvis obtained 12/18 and revealed multiple rim enhancing fluid collections throughout the abdomen and pelvis, some of which may be partially contiguous; JP-drain in the RLQ is not immediately adjacent to any of the fluid collections. IV antibiotics switched to zosyn on 12/19.  Patient was transferred to [facility] 12/19 and IR placed a percutaneous drain in the right abdominal abscess on 12/20 which yielded 60cc pus. Culture sent and is pending. General surgery asked to see. Patient states that she is feeling much better. She denies any abdominal pain, nausea, or vomiting. Tolerating a diet and had a BM yesterday. WBC is down to 15 today and she is afebrile. She has been mobilizing around the room without issues. Asking when she can go home.  Previously smoked 1PPD x30 years, quit smoking 1-2 months ago Currently living at home with her parents Not currently employed, on disability  Review of Systems  Constitutional: Negative.   Respiratory: Negative.   Cardiovascular: Negative.   Gastrointestinal: Negative.   Genitourinary: Negative.   Musculoskeletal: Negative.   All other systems reviewed and are negative.  Above is current review of systems  All systems reviewed and otherwise  negative except for as above  Family History  Problem Relation Age of Onset  . Breast cancer Mother   . Liver disease Neg Hx   . Colon cancer Neg Hx     Past Medical History:  Diagnosis Date  . ADD (attention deficit disorder)   . Anxiety   . Bipolar 1 disorder (HCC)   . Depression   . HTN (hypertension)   . Hypercholesteremia   . Hypothyroidism     Past Surgical History:  Procedure Laterality Date  . APPENDECTOMY    . CERVIX SURGERY    . COLONOSCOPY WITH PROPOFOL N/A 05/10/2019   Procedure: COLONOSCOPY WITH PROPOFOL;  Surgeon: Corbin Ade, MD;  Location: [facility] ENDO SUITE;  Service: Endoscopy;  Laterality: N/A;  2:30pm - pt does not want to move up  . HAND EXPLORATION Left    removal of glass from finger  . LAPAROSCOPIC APPENDECTOMY N/A 10/30/2020   Procedure: APPENDECTOMY LAPAROSCOPIC;  Surgeon: Franky Macho, MD;  Location: [facility] ORS;  Service: General;  Laterality: N/A;  . MANDIBLE SURGERY      Social History:  reports that she has been smoking cigarettes. She has a 28.00 pack-year smoking history. She has never used smokeless tobacco. She reports current alcohol use. She reports previous drug use.  Allergies: No Known Allergies  Medications Prior to Admission  Medication Sig Dispense Refill  . ALPRAZolam (XANAX XR) 1 MG 24 hr tablet Take 1 mg by mouth every morning.     Marland Kitchen ALPRAZolam (XANAX) 1 MG tablet Take 1 tablet by mouth at bedtime as needed.    . desvenlafaxine (PRISTIQ) 100 MG 24 hr tablet Take 100 mg by mouth 2 (two)  times daily.     . hydrOXYzine (VISTARIL) 25 MG capsule Take 25 mg by mouth 3 (three) times daily as needed.    Marland Kitchen levothyroxine (SYNTHROID) 112 MCG tablet Take 112 mcg by mouth daily before breakfast.     . LINZESS 290 MCG CAPS capsule TAKE ONE (1) CAPSULE EACH DAY (Patient taking differently: Take 290 mcg by mouth daily before breakfast.) 90 capsule 3  . lisinopril-hydrochlorothiazide (ZESTORETIC) 20-12.5 MG tablet Take 1 tablet by mouth daily.    .  ondansetron (ZOFRAN-ODT) 8 MG disintegrating tablet Take 8 mg by mouth 3 (three) times daily.    Marland Kitchen oxyCODONE-acetaminophen (PERCOCET) 5-325 MG tablet Take 1 tablet by mouth every 4 (four) hours as needed for severe pain. 30 tablet 0  . QUEtiapine (SEROQUEL) 300 MG tablet Take 600 mg by mouth at bedtime.     . simvastatin (ZOCOR) 20 MG tablet Take 20 mg by mouth daily.     . Vitamin D, Ergocalciferol, (DRISDOL) 1.25 MG (50000 UT) CAPS capsule Take 50,000 Units by mouth once a week. Saturdays    . amoxicillin-clavulanate (AUGMENTIN) 875-125 MG tablet Take 1 tablet by mouth 2 (two) times daily. 10 tablet 0  . potassium chloride 20 MEQ TBCR Take 10 mEq by mouth daily. 14 tablet 0    Prior to Admission medications   Medication Sig Start Date End Date Taking? Authorizing Provider  ALPRAZolam (XANAX XR) 1 MG 24 hr tablet Take 1 mg by mouth every morning.    Yes [provider]  ALPRAZolam Prudy Feeler) 1 MG tablet Take 1 tablet by mouth at bedtime as needed.   Yes [provider]  desvenlafaxine (PRISTIQ) 100 MG 24 hr tablet Take 100 mg by mouth 2 (two) times daily.    Yes [provider]  hydrOXYzine (VISTARIL) 25 MG capsule Take 25 mg by mouth 3 (three) times daily as needed.   Yes [provider]  levothyroxine (SYNTHROID) 112 MCG tablet Take 112 mcg by mouth daily before breakfast.  01/19/19  Yes [provider]  LINZESS 290 MCG CAPS capsule TAKE ONE (1) CAPSULE EACH DAY Patient taking differently: Take 290 mcg by mouth daily before breakfast. 10/08/19  Yes Gelene Mink, NP  lisinopril-hydrochlorothiazide (ZESTORETIC) 20-12.5 MG tablet Take 1 tablet by mouth daily. 10/01/17  Yes [provider]  ondansetron (ZOFRAN-ODT) 8 MG disintegrating tablet Take 8 mg by mouth 3 (three) times daily. 10/28/20  Yes [provider]  oxyCODONE-acetaminophen (PERCOCET) 5-325 MG tablet Take 1 tablet by mouth every 4 (four) hours as needed for severe pain.  10/31/20 10/31/21 Yes Franky Macho, MD  QUEtiapine (SEROQUEL) 300 MG tablet Take 600 mg by mouth at bedtime.    Yes [provider]  simvastatin (ZOCOR) 20 MG tablet Take 20 mg by mouth daily.  10/01/17  Yes [provider]  Vitamin D, Ergocalciferol, (DRISDOL) 1.25 MG (50000 UT) CAPS capsule Take 50,000 Units by mouth once a week. Saturdays 03/13/18  Yes [provider]  amoxicillin-clavulanate (AUGMENTIN) 875-125 MG tablet Take 1 tablet by mouth 2 (two) times daily. 10/31/20   Franky Macho, MD  potassium chloride 20 MEQ TBCR Take 10 mEq by mouth daily. 10/31/20   Franky Macho, MD    Blood pressure 132/72, pulse 83, temperature 98 F (36.7 C), temperature source Oral, resp. rate 18, height 5\' 4"  (1.626 m), weight 95.2 kg, last menstrual period 10/28/2020, SpO2 96 %. Physical Exam: General: pleasant, WD/WN female who is laying in bed in NAD HEENT: head  is normocephalic, atraumatic.  Sclera are noninjected.  PERRL.  Ears and nose without any masses or lesions.  Mouth is pink and moist. Dentition fair Heart: regular, rate, and rhythm.  Normal s1,s2. No obvious murmurs, gallops, or rubs noted.  Palpable pedal pulses bilaterally  Lungs: CTAB, no wheezes, rhonchi, or rales noted.  Respiratory effort nonlabored Abd: obese, well healing laparoscopic incisions with staples intact and no erythema or drainage, soft, NT/ND, +BS, no masses, hernias, or organomegaly. Surgical JP drain with serosanguinous fluid in bulb, IR drain with thick/light pink drainage in bulb MS: no BUE/BLE edema, calves soft and nontender Skin: warm and dry with no masses, lesions, or rashes Psych: A&Ox4 with an appropriate affect Neuro: cranial nerves grossly intact, equal strength in BUE/BLE bilaterally, normal speech, thought process intact  Results for orders placed or performed during the hospital encounter of 10/31/20 (from the past 48 hour(s))  CBC with Differential/Platelet     Status: Abnormal    Collection Time: 11/06/20  1:01 AM  Result Value Ref Range   WBC 15.6 (H) 4.0 - 10.5 K/uL   RBC 3.38 (L) 3.87 - 5.11 MIL/uL   Hemoglobin 10.4 (L) 12.0 - 15.0 g/dL   HCT 40.931.4 (L) 81.136.0 - 91.446.0 %   MCV 92.9 80.0 - 100.0 fL   MCH 30.8 26.0 - 34.0 pg   MCHC 33.1 30.0 - 36.0 g/dL   RDW 78.214.6 95.611.5 - 21.315.5 %   Platelets 317 150 - 400 K/uL   nRBC 0.1 0.0 - 0.2 %   Neutrophils Relative % 76 %   Neutro Abs 11.9 (H) 1.7 - 7.7 K/uL   Lymphocytes Relative 17 %   Lymphs Abs 2.7 0.7 - 4.0 K/uL   Monocytes Relative 5 %   Monocytes Absolute 0.8 0.1 - 1.0 K/uL   Eosinophils Relative 2 %   Eosinophils Absolute 0.3 0.0 - 0.5 K/uL   Basophils Relative 0 %   Basophils Absolute 0.0 0.0 - 0.1 K/uL   WBC Morphology See Note     Comment: Mild Left Shift. 1 to 5% Metas and Myelos, Occ Pro Noted.   nRBC 0 0 /100 WBC   Abs Immature Granulocytes 0.00 0.00 - 0.07 K/uL    Comment: Performed at [facility], [address] [place], [place] [address]  Basic metabolic panel     Status: Abnormal   Collection Time: 11/06/20  1:01 AM  Result Value Ref Range   Sodium 138 135 - 145 mmol/L   Potassium 4.0 3.5 - 5.1 mmol/L   Chloride 106 98 - 111 mmol/L   CO2 23 22 - 32 mmol/L   Glucose, Bld 93 70 - 99 mg/dL    Comment: Glucose reference range applies only to samples taken after fasting for at least 8 hours.   BUN 5 (L) 6 - 20 mg/dL   Creatinine, Ser 8.460.99 0.44 - 1.00 mg/dL   Calcium 8.2 (L) 8.9 - 10.3 mg/dL   GFR, Estimated >96>60 >29>60 mL/min    Comment: (NOTE) Calculated using the CKD-EPI Creatinine Equation (2021)    Anion gap 9 5 - 15    Comment: Performed at [facility], [address] [place], [place] [address]  Magnesium     Status: None   Collection Time: 11/06/20  1:01 AM  Result Value Ref Range   Magnesium 2.0 1.7 - 2.4 mg/dL    Comment: Performed at [facility], [address] [place], [place] [address]  Phosphorus  Status: None   Collection Time: 11/06/20  1:01 AM  Result  Value Ref Range   Phosphorus 4.0 2.5 - 4.6 mg/dL    Comment: Performed at [facility], [address] [place], [place] [address]  Protime-INR     Status: None   Collection Time: 11/06/20 10:25 AM  Result Value Ref Range   Prothrombin Time 14.5 11.4 - 15.2 seconds   INR 1.2 0.8 - 1.2    Comment: (NOTE) INR goal varies based on device and disease states. Performed at [facility], [address] [place], [place] [address]   Aerobic/Anaerobic Culture (surgical/deep wound)     Status: None (Preliminary result)   Collection Time: 11/06/20  4:02 PM   Specimen: Abscess  Result Value Ref Range   Specimen Description ABSCESS RIGHT ABDOMEN    Special Requests DRAINAGE    Gram Stain      FEW WBC PRESENT,BOTH PMN AND MONONUCLEAR FEW GRAM NEGATIVE RODS RARE GRAM POSITIVE COCCI Performed at [facility], [address], [place], [place] [address]    Culture PENDING    Report Status PENDING   CBC with Differential/Platelet     Status: Abnormal   Collection Time: 11/07/20  4:32 AM  Result Value Ref Range   WBC 15.0 (H) 4.0 - 10.5 K/uL   RBC 3.40 (L) 3.87 - 5.11 MIL/uL   Hemoglobin 10.5 (L) 12.0 - 15.0 g/dL   HCT 91.4 (L) 78.2 - 95.6 %   MCV 91.8 80.0 - 100.0 fL   MCH 30.9 26.0 - 34.0 pg   MCHC 33.7 30.0 - 36.0 g/dL   RDW 21.3 08.6 - 57.8 %   Platelets 357 150 - 400 K/uL   nRBC 0.0 0.0 - 0.2 %   Neutrophils Relative % 74 %   Neutro Abs 11.1 (H) 1.7 - 7.7 K/uL   Lymphocytes Relative 13 %   Lymphs Abs 2.0 0.7 - 4.0 K/uL   Monocytes Relative 4 %   Monocytes Absolute 0.5 0.1 - 1.0 K/uL   Eosinophils Relative 1 %   Eosinophils Absolute 0.2 0.0 - 0.5 K/uL   Basophils Relative 0 %   Basophils Absolute 0.1 0.0 - 0.1 K/uL   WBC Morphology TOXIC GRANULATION     Comment: VACUOLATED NEUTROPHILS   Immature Granulocytes 8 %   Abs Immature Granulocytes 1.16 (H) 0.00 - 0.07 K/uL    Comment: Performed at [facility], [address] [place], [place] [address]   Magnesium     Status: None   Collection Time: 11/07/20  4:32 AM  Result Value Ref Range   Magnesium 1.8 1.7 - 2.4 mg/dL    Comment: Performed at [facility], [address], [place], [place] [address]  Phosphorus     Status: Abnormal   Collection Time: 11/07/20  4:32 AM  Result Value Ref Range   Phosphorus 4.8 (H) 2.5 - 4.6 mg/dL    Comment: Performed at [facility], [address], [place], [place] [address]  Comprehensive metabolic panel     Status: Abnormal   Collection Time: 11/07/20  4:32 AM  Result Value Ref Range   Sodium 137 135 - 145 mmol/L   Potassium 3.5 3.5 - 5.1 mmol/L   Chloride 105 98 - 111 mmol/L   CO2 22 22 - 32 mmol/L   Glucose, Bld 101 (H) 70 - 99 mg/dL    Comment: Glucose reference range applies only to samples taken after  fasting for at least 8 hours.   BUN 5 (L) 6 - 20 mg/dL   Creatinine, Ser 9.21 0.44 - 1.00 mg/dL   Calcium 8.0 (L) 8.9 - 10.3 mg/dL   Total Protein 6.2 (L) 6.5 - 8.1 g/dL   Albumin 2.0 (L) 3.5 - 5.0 g/dL   AST 12 (L) 15 - 41 U/L   ALT 14 0 - 44 U/L   Alkaline Phosphatase 66 38 - 126 U/L   Total Bilirubin 0.4 0.3 - 1.2 mg/dL   GFR, Estimated >19 >41 mL/min    Comment: (NOTE) Calculated using the CKD-EPI Creatinine Equation (2021)    Anion gap 10 5 - 15    Comment: Performed at [facility], [address], [place], [place] [address]   CT IMAGE GUIDED DRAINAGE BY PERCUTANEOUS CATHETER  Result Date: 11/06/2020 INDICATION: Postop appendectomy, right abdominal mesenteric abscess EXAM: CT GUIDED DRAINAGE OF THE RIGHT ABDOMINAL MESENTERIC ABSCESS MEDICATIONS: The patient is currently admitted to the hospital and receiving intravenous antibiotics. The antibiotics were administered within an appropriate time frame prior to the initiation of the procedure. ANESTHESIA/SEDATION: Fentanyl 50 mcg IV; Versed 1.0 mg IV Moderate Sedation Time:  13 MINUTES The patient was continuously monitored during the procedure by the interventional  radiology nurse under my direct supervision. COMPLICATIONS: None immediate. PROCEDURE: Informed written consent was obtained from the patient after a thorough discussion of the procedural risks, benefits and alternatives. All questions were addressed. Maximal Sterile Barrier Technique was utilized including caps, mask, sterile gowns, sterile gloves, sterile drape, hand hygiene and skin antiseptic. A timeout was performed prior to the initiation of the procedure. Previous imaging reviewed. Patient positioned supine. Noncontrast localization CT performed. The right abdominal mesenteric enlarging abscess was localized and marked for a lateral approach. Under sterile conditions and local anesthesia, an 18 gauge 15 cm access needle was advanced percutaneously into the abscess. Needle position confirmed with CT. Syringe aspiration yielded purulent fluid. Guidewire inserted followed by tract dilatation insert a 10 French drain. Drain catheter position confirmed with CT. 60 cc purulent fluid aspirated. Sample sent for culture. Catheter secured with a Prolene suture and connected to external suction bulb. Sterile dressing applied. No immediate complication. Patient tolerated the procedure well. IMPRESSION: Successful CT-guided right abdominal mesenteric abscess drain placement. Electronically Signed   By: Judie Petit.  Shick M.D.   On: 11/06/2020 16:45    Anti-infectives (From admission, onward)   Start     Dose/Rate Route Frequency Ordered Stop   11/05/20 1715  piperacillin-tazobactam (ZOSYN) IVPB 3.375 g        3.375 g 12.5 mL/hr over 240 Minutes Intravenous Every 8 hours 11/05/20 1621     11/01/20 1130  cefoTEtan (CEFOTAN) 2 g in sodium chloride 0.9 % 100 mL IVPB  Status:  Discontinued        2 g 200 mL/hr over 30 Minutes Intravenous Every 12 hours 11/01/20 1115 11/05/20 1621       Assessment/Plan HTN HLD Hypothyroidism Obesity BMI 36 Bipolar, depression, anxiety Tobacco abuse - previously smoked 1 PPD x30  years, quit smoking 2 months ago  Perforated gangrenous appendicitis -S/p laparoscopic appendectomy with JP drain placement 10/30/20 by Dr. Lovell Sheehan  Multiple postoperative intraabdominal abscesses - noted on CT 12/18 -S/p IR drain placement right abdominal abscess - cx pending  Patient seems to be doing well today as she is having no abdominal pain, tolerating diet, and having bowel function. IR drain is in and functioning. WBC is down trending and she  is afebrile. Given perforated appendicitis and abscess development, recommend continuing IV antibiotics 1-2 more days and monitor clinically. Repeat CBC in AM.   Once ready for discharge she will need to go home on oral antibiotics with drain, and follow up with IR and Dr. Lovell Sheehan.   ID - cefotetan 12/15>>12/19, zosyn 12/19>>day#3 VTE - SCDs, lovenox FEN - IVF, reg diet Foley - none Follow up - Dr. Lovell Sheehan, IR  Franne Forts, [facility] Surgery 11/07/2020, 9:09 AM Please see Amion for pager number during day hours 7:00am-4:30pm  Agree with above. On chronic disabililty.  Lives with her parents.  Ovidio Kin, MD, [facility] Surgery Office phone:  [phone]

## 2020-11-08 DIAGNOSIS — R112 Nausea with vomiting, unspecified: Secondary | ICD-10-CM

## 2020-11-08 LAB — COMPREHENSIVE METABOLIC PANEL
ALT: 11 U/L (ref 0–44)
AST: 12 U/L — ABNORMAL LOW (ref 15–41)
Albumin: 2 g/dL — ABNORMAL LOW (ref 3.5–5.0)
Alkaline Phosphatase: 67 U/L (ref 38–126)
Anion gap: 8 (ref 5–15)
BUN: <5 mg/dL — ABNORMAL LOW (ref 6–20)
CO2: 23 mmol/L (ref 22–32)
Calcium: 8.2 mg/dL — ABNORMAL LOW (ref 8.9–10.3)
Chloride: 107 mmol/L (ref 98–111)
Creatinine, Ser: 0.9 mg/dL (ref 0.44–1.00)
GFR, Estimated: >60 mL/min (ref >60)
Glucose, Bld: 107 mg/dL — ABNORMAL HIGH (ref 70–99)
Potassium: 4 mmol/L (ref 3.5–5.1)
Sodium: 138 mmol/L (ref 135–145)
Total Bilirubin: 0.7 mg/dL (ref 0.3–1.2)
Total Protein: 6.4 g/dL — ABNORMAL LOW (ref 6.5–8.1)

## 2020-11-08 LAB — CBC WITH DIFFERENTIAL/PLATELET
Abs Immature Granulocytes: 0.77 10*3/uL — ABNORMAL HIGH (ref 0.00–0.07)
Basophils Absolute: 0.1 10*3/uL (ref 0.0–0.1)
Basophils Relative: 1 %
Eosinophils Absolute: 0.2 10*3/uL (ref 0.0–0.5)
Eosinophils Relative: 2 %
HCT: 31.2 % — ABNORMAL LOW (ref 36.0–46.0)
Hemoglobin: 10.4 g/dL — ABNORMAL LOW (ref 12.0–15.0)
Immature Granulocytes: 6 %
Lymphocytes Relative: 18 %
Lymphs Abs: 2.3 10*3/uL (ref 0.7–4.0)
MCH: 31.2 pg (ref 26.0–34.0)
MCHC: 33.3 g/dL (ref 30.0–36.0)
MCV: 93.7 fL (ref 80.0–100.0)
Monocytes Absolute: 0.6 10*3/uL (ref 0.1–1.0)
Monocytes Relative: 5 %
Neutro Abs: 8.7 10*3/uL — ABNORMAL HIGH (ref 1.7–7.7)
Neutrophils Relative %: 68 %
Platelets: 374 10*3/uL (ref 150–400)
RBC: 3.33 MIL/uL — ABNORMAL LOW (ref 3.87–5.11)
RDW: 14.8 % (ref 11.5–15.5)
WBC: 12.7 10*3/uL — ABNORMAL HIGH (ref 4.0–10.5)
nRBC: 0 % (ref 0.0–0.2)

## 2020-11-08 LAB — MAGNESIUM: Magnesium: 1.8 mg/dL (ref 1.7–2.4)

## 2020-11-08 MED ORDER — PANTOPRAZOLE SODIUM 40 MG PO TBEC
40.0000 mg | DELAYED_RELEASE_TABLET | Freq: Every day | ORAL | Status: DC
  Administered 2020-11-08: 21:00:00 40 mg via ORAL
  Filled 2020-11-08: qty 1

## 2020-11-08 NOTE — Progress Notes (Signed)
Staples removed as ordered per Dr Ezzard Standing.  Removed from 2 small incisions on abd.  Sites appear to be healing nicely, small amount of redness noted, edges intact.  The patient was educated on care of the JP drain- shown how to empty and recharge bulb.  The patient reports that she understands those instructions

## 2020-11-08 NOTE — Progress Notes (Signed)
[facility] Office:  [phone] General Surgery Progress Note   LOS: 7 days  POD -     Assessment and Plan: 1.  Intraabdominal abscess   Perc drain - 11/06/2020  WBC - 12,700 - 11/08/2020  Zosyn  Tolerating diet.  Will show patient how to manage drains.  Should be okay to go home tomorrow.  Will need oral antibiotics for at least 7 more days and follow up CT scan.  2.  Perforated gangrenous appendicitis - S/p laparoscopic appendectomy with JP drain placement 10/30/20 by Dr. Lovell Sheehan 3.  HTN 4.  Hypothyroidism 5.  Obesity BMI 36 6.  Bipolar, depression, anxiety 7.  Tobacco abuse  previously smoked 1 PPD x30 years, quit smoking 2 months ago 8.  DVT prophylaxis - Lovenox   Principal Problem:   Ileus, postoperative (HCC) Active Problems:   Hypokalemia   Intra-abdominal abscess (HCC)  Subjective:  No complaints.  Doing well.  Objective:   Vitals:   11/08/20 0411 11/08/20 0759  BP: (!) 144/91 (!) 154/91  Pulse: 71 69  Resp: 16 17  Temp: 98.6 F (37 C) 98.5 F (36.9 C)  SpO2: 97% 97%     Intake/Output from previous day:  12/21 0701 - 12/22 0700 In: 400 [P.O.:400] Out: 60 [Drains:60]  Intake/Output this shift:  No intake/output data recorded.   Physical Exam:   General: Obese WF who is alert and oriented.    HEENT: Normal. Pupils equal. .   Lungs: Clear   Abdomen: Soft   Wound: Drain in the left abdomen and right abdomen.  R/L - 20 and 40 cc last 24 hours   Lab Results:    Recent Labs    11/07/20 0432 11/08/20 0304  WBC 15.0* 12.7*  HGB 10.5* 10.4*  HCT 31.2* 31.2*  PLT 357 374    BMET   Recent Labs    11/07/20 0432 11/08/20 0304  NA 137 138  K 3.5 4.0  CL 105 107  CO2 22 23  GLUCOSE 101* 107*  BUN 5* <5*  CREATININE 0.82 0.90  CALCIUM 8.0* 8.2*    PT/INR   Recent Labs    11/06/20 1025  LABPROT 14.5  INR 1.2    ABG  No results for input(s): PHART, HCO3 in the last 72 hours.  Invalid input(s): PCO2,  PO2   Studies/Results:  CT IMAGE GUIDED DRAINAGE BY PERCUTANEOUS CATHETER  Result Date: 11/06/2020 INDICATION: Postop appendectomy, right abdominal mesenteric abscess EXAM: CT GUIDED DRAINAGE OF THE RIGHT ABDOMINAL MESENTERIC ABSCESS MEDICATIONS: The patient is currently admitted to the hospital and receiving intravenous antibiotics. The antibiotics were administered within an appropriate time frame prior to the initiation of the procedure. ANESTHESIA/SEDATION: Fentanyl 50 mcg IV; Versed 1.0 mg IV Moderate Sedation Time:  13 MINUTES The patient was continuously monitored during the procedure by the interventional radiology nurse under my direct supervision. COMPLICATIONS: None immediate. PROCEDURE: Informed written consent was obtained from the patient after a thorough discussion of the procedural risks, benefits and alternatives. All questions were addressed. Maximal Sterile Barrier Technique was utilized including caps, mask, sterile gowns, sterile gloves, sterile drape, hand hygiene and skin antiseptic. A timeout was performed prior to the initiation of the procedure. Previous imaging reviewed. Patient positioned supine. Noncontrast localization CT performed. The right abdominal mesenteric enlarging abscess was localized and marked for a lateral approach. Under sterile conditions and local anesthesia, an 18 gauge 15 cm access needle was advanced percutaneously into the abscess. Needle position confirmed with CT. Syringe  aspiration yielded purulent fluid. Guidewire inserted followed by tract dilatation insert a 10 French drain. Drain catheter position confirmed with CT. 60 cc purulent fluid aspirated. Sample sent for culture. Catheter secured with a Prolene suture and connected to external suction bulb. Sterile dressing applied. No immediate complication. Patient tolerated the procedure well. IMPRESSION: Successful CT-guided right abdominal mesenteric abscess drain placement. Electronically Signed   By: Judie Petit.   Shick M.D.   On: 11/06/2020 16:45     Anti-infectives:   Anti-infectives (From admission, onward)   Start     Dose/Rate Route Frequency Ordered Stop   11/05/20 1715  piperacillin-tazobactam (ZOSYN) IVPB 3.375 g        3.375 g 12.5 mL/hr over 240 Minutes Intravenous Every 8 hours 11/05/20 1621     11/01/20 1130  cefoTEtan (CEFOTAN) 2 g in sodium chloride 0.9 % 100 mL IVPB  Status:  Discontinued        2 g 200 mL/hr over 30 Minutes Intravenous Every 12 hours 11/01/20 1115 11/05/20 1621      Ovidio Kin, MD, [facility] Surgery Office: [phone] 11/08/2020

## 2020-11-08 NOTE — Progress Notes (Signed)
PROGRESS NOTE    Evelyn Simpson  RUE:[id number] DOB: 10/30/1976 DOA: 10/31/2020 PCP: Rebecka ApleyHemberg, Katherine V, NP     Brief Narrative:  44 year old white female Bipolar 1 disorder, Depression, ADD, HTN, Hypothyroid, HLD, Hypothyroidism Status post lap appendectomy drain placement 12/13 discharged home Developed postop ileus and returned same day Because of increasing leukocytosis-it was felt she may have advancing infection CT performed showed multiple rim-enhancing fluid collections JP drain right lower quadrant General surgery Jeani HawkingAnnie Penn discussed with IR planning-patient was kept n.p.o. placed on IV cefotetan which was broadened to Zosyn Currently awaiting IR input who performed CT guided abscess drain and general surgery was consulted  laparoscopic appendectomy with drain placement for perforated appendicitis on 12/13 and was d/c home on 12/14. She returned to the ED that evening with nausea and vomiting. She vomited multiple times and was unable to keep anything down. She had a Xray that demonstrated post operative ileus. She continues to have vomiting. She had NG attempted X 4 and is willing to try again.  Subjective: Afebrile overnight, negative abdominal pain, negative nausea, negative vomiting.  Patient states eating part of her meals   Assessment & Plan: Covid vaccination; vaccinated   Principal Problem:   Ileus, postoperative (HCC) Active Problems:   Hypokalemia   Intra-abdominal abscess (HCC)  Multiloculated abscesses in abdomen -Bilateral JP drains in place in the abdomen. -Repeat CT scan 12/18 shows a small collection posterior to bowel loop 3 x 1.2 cm and another 6.9 x 3.3 cm-JP drain not in place where these abscesses are -CT guided drain placed 12/20 with 60 cc aspirated; cultures pending -Continue current antibiotics per surgery -D5-0.45% saline at 6470ml/hr -Diet per general surgery -12/13 s/p appendectomy drain placement  Bipolar -Seroquel 600 mg  daily -Pristiq 150 mg daily -Hydroxyzine, Ativan (hold)  Essential HTN -Metoprolol PRN  HLD -Lipid panel pending  Hypothyroidism -Synthroid 112 mcg daily    DVT prophylaxis: Lovenox Code Status: Full Family Communication:  Status is: Inpatient    Dispo: The patient is from: Home              Anticipated d/c is to: Home vs SNF              Anticipated d/c date is: 12/27              Patient currently unstable      Consultants:  Orthopedic surgery  Procedures/Significant Events:    I have personally reviewed and interpreted all radiology studies and my findings are as above.  VENTILATOR SETTINGS:    Cultures   Antimicrobials: Anti-infectives (From admission, onward)   Start     Ordered Stop   11/05/20 1715  piperacillin-tazobactam (ZOSYN) IVPB 3.375 g        11/05/20 1621     11/01/20 1130  cefoTEtan (CEFOTAN) 2 g in sodium chloride 0.9 % 100 mL IVPB  Status:  Discontinued        11/01/20 1115 11/05/20 1621       Devices    LINES / TUBES:      Continuous Infusions: . dextrose 5 % and 0.45 % NaCl with KCl 20 mEq/L 70 mL/hr at 11/07/20 1847  . piperacillin-tazobactam (ZOSYN)  IV 3.375 g (11/08/20 0642)     Objective: Vitals:   11/07/20 1512 11/07/20 1947 11/08/20 0411 11/08/20 0759  BP: (!) 155/89 (!) 160/86 (!) 144/91 (!) 154/91  Pulse: 70 (!) 58 71 69  Resp: 17 16 16 17   Temp: 98.9 F (37.2 C)  98.8 F (37.1 C) 98.6 F (37 C) 98.5 F (36.9 C)  TempSrc: Oral   Oral  SpO2: 100% 100% 97% 97%  Weight:      Height:        Intake/Output Summary (Last 24 hours) at 11/08/2020 1610 Last data filed at 11/08/2020 0300 Gross per 24 hour  Intake 400 ml  Output 60 ml  Net 340 ml   Filed Weights   10/31/20 2203 11/02/20 0940  Weight: 97.5 kg 95.2 kg    Examination:  General: A/O x4, No acute respiratory distress Eyes: negative scleral hemorrhage, negative anisocoria, negative icterus ENT: Negative Runny nose, negative gingival  bleeding, Neck:  Negative scars, masses, torticollis, lymphadenopathy, JVD Lungs: Clear to auscultation bilaterally without wheezes or crackles Cardiovascular: Regular rate and rhythm without murmur gallop or rub normal S1 and S2 Abdomen: negative abdominal pain, mild distention, positive soft, bowel sounds, bilateral JP drains in place, draining serosanguineous fluid Extremities: No significant cyanosis, clubbing, or edema bilateral lower extremities Skin: Negative rashes, lesions, ulcers Psychiatric:  Negative depression, negative anxiety, negative fatigue, negative mania  Central nervous system:  Cranial nerves II through XII intact, tongue/uvula midline, all extremities muscle strength 5/5, sensation intact throughout, negative dysarthria, negative expressive aphasia, negative receptive aphasia.  .     Data Reviewed: Care during the described time interval was provided by me .  I have reviewed this patient's available data, including medical history, events of note, physical examination, and all test results as part of my evaluation.  CBC: Recent Labs  Lab 11/04/20 0451 11/05/20 0442 11/06/20 0101 11/07/20 0432 11/08/20 0304  WBC 15.3* 17.2* 15.6* 15.0* 12.7*  NEUTROABS 11.2* 14.4* 11.9* 11.1* 8.7*  HGB 11.4* 10.8* 10.4* 10.5* 10.4*  HCT 34.3* 32.5* 31.4* 31.2* 31.2*  MCV 93.5 93.4 92.9 91.8 93.7  PLT 274 300 317 357 374   Basic Metabolic Panel: Recent Labs  Lab 11/04/20 0451 11/05/20 0442 11/06/20 0101 11/07/20 0432 11/08/20 0304  NA 137 136 138 137 138  K 3.0* 3.5 4.0 3.5 4.0  CL 100 104 106 105 107  CO2 GLUCOSE 91 98 93 101* 107*  BUN 6 <5* 5* 5* <5*  CREATININE 0.72 0.81 0.99 0.82 0.90  CALCIUM 8.0* 7.8* 8.2* 8.0* 8.2*  MG 2.1 1.9 2.0 1.8 1.8  PHOS  --  2.6 4.0 4.8*  --    GFR: Estimated Creatinine Clearance: 89.3 mL/min (by C-G formula based on SCr of 0.9 mg/dL). Liver Function Tests: Recent Labs  Lab 11/07/20 0432 11/08/20 0304  AST  12* 12*  ALT 14 11  ALKPHOS 66 67  BILITOT 0.4 0.7  PROT 6.2* 6.4*  ALBUMIN 2.0* 2.0*   No results for input(s): LIPASE, AMYLASE in the last 168 hours. No results for input(s): AMMONIA in the last 168 hours. Coagulation Profile: Recent Labs  Lab 11/06/20 1025  INR 1.2   Cardiac Enzymes: No results for input(s): CKTOTAL, CKMB, CKMBINDEX, TROPONINI in the last 168 hours. BNP (last 3 results) No results for input(s): PROBNP in the last 8760 hours. HbA1C: No results for input(s): HGBA1C in the last 72 hours. CBG: Recent Labs  Lab 11/02/20 0906  GLUCAP 149*   Lipid Profile: No results for input(s): CHOL, HDL, LDLCALC, TRIG, CHOLHDL, LDLDIRECT in the last 72 hours. Thyroid Function Tests: No results for input(s): TSH, T4TOTAL, FREET4, T3FREE, THYROIDAB in the last 72 hours. Anemia Panel: No results for input(s): VITAMINB12, FOLATE, FERRITIN, TIBC, IRON, RETICCTPCT in the  last 72 hours. Sepsis Labs: No results for input(s): PROCALCITON, LATICACIDVEN in the last 168 hours.  Recent Results (from the past 240 hour(s))  Resp Panel by RT-PCR (Flu A&B, Covid) Nasopharyngeal Swab     Status: None   Collection Time: 10/29/20 12:31 PM   Specimen: Nasopharyngeal Swab; Nasopharyngeal(NP) swabs in vial transport medium  Result Value Ref Range Status   SARS Coronavirus 2 by RT PCR NEGATIVE NEGATIVE Final    Comment: (NOTE) SARS-CoV-2 target nucleic acids are NOT DETECTED.  The SARS-CoV-2 RNA is generally detectable in upper respiratory specimens during the acute phase of infection. The lowest concentration of SARS-CoV-2 viral copies this assay can detect is 138 copies/mL. A negative result does not preclude SARS-Cov-2 infection and should not be used as the sole basis for treatment or other patient management decisions. A negative result may occur with  improper specimen collection/handling, submission of specimen other than nasopharyngeal swab, presence of viral mutation(s) within  the areas targeted by this assay, and inadequate number of viral copies(<138 copies/mL). A negative result must be combined with clinical observations, patient history, and epidemiological information. The expected result is Negative.  Fact Sheet for Patients:  BloggerCourse.com  Fact Sheet for Healthcare Providers:  SeriousBroker.it  This test is no t yet approved or cleared by the [place] FDA and  has been authorized for detection and/or diagnosis of SARS-CoV-2 by FDA under an Emergency Use Authorization (EUA). This EUA will remain  in effect (meaning this test can be used) for the duration of the COVID-19 declaration under Section 564(b)(1) of the Act, 21 U.S.C.section 360bbb-3(b)(1), unless the authorization is terminated  or revoked sooner.       Influenza A by PCR NEGATIVE NEGATIVE Final   Influenza B by PCR NEGATIVE NEGATIVE Final    Comment: (NOTE) The Xpert Xpress SARS-CoV-2/FLU/RSV plus assay is intended as an aid in the diagnosis of influenza from Nasopharyngeal swab specimens and should not be used as a sole basis for treatment. Nasal washings and aspirates are unacceptable for Xpert Xpress SARS-CoV-2/FLU/RSV testing.  Fact Sheet for Patients: BloggerCourse.com  Fact Sheet for Healthcare Providers: SeriousBroker.it  This test is not yet approved or cleared by the [place] FDA and has been authorized for detection and/or diagnosis of SARS-CoV-2 by FDA under an Emergency Use Authorization (EUA). This EUA will remain in effect (meaning this test can be used) for the duration of the COVID-19 declaration under Section 564(b)(1) of the Act, 21 U.S.C. section 360bbb-3(b)(1), unless the authorization is terminated or revoked.  Performed at [facility], [address], [place], [place] [address]   Surgical PCR screen     Status: None   Collection Time:  10/30/20  1:55 AM   Specimen: Nasal Mucosa; Nasal Swab  Result Value Ref Range Status   MRSA, PCR NEGATIVE NEGATIVE Final   Staphylococcus aureus NEGATIVE NEGATIVE Final    Comment: (NOTE) The Xpert SA Assay (FDA approved for NASAL specimens in patients 60 years of age and older), is one component of a comprehensive surveillance program. It is not intended to diagnose infection nor to guide or monitor treatment. Performed at [facility], [address] [place], [place] [address]   Culture, blood (single) w Reflex to ID Panel     Status: None   Collection Time: 10/31/20 11:04 PM   Specimen: BLOOD  Result Value Ref Range Status   Specimen Description BLOOD  Final   Special Requests NONE  Final   Culture   Final  NO GROWTH 6 DAYS Performed at [facility], [address] [place], [place] [address]    Report Status 11/06/2020 FINAL  Final  Resp Panel by RT-PCR (Flu A&B, Covid) Nasopharyngeal Swab     Status: None   Collection Time: 11/01/20  3:02 AM   Specimen: Nasopharyngeal Swab; Nasopharyngeal(NP) swabs in vial transport medium  Result Value Ref Range Status   SARS Coronavirus 2 by RT PCR NEGATIVE NEGATIVE Final    Comment: (NOTE) SARS-CoV-2 target nucleic acids are NOT DETECTED.  The SARS-CoV-2 RNA is generally detectable in upper respiratory specimens during the acute phase of infection. The lowest concentration of SARS-CoV-2 viral copies this assay can detect is 138 copies/mL. A negative result does not preclude SARS-Cov-2 infection and should not be used as the sole basis for treatment or other patient management decisions. A negative result may occur with  improper specimen collection/handling, submission of specimen other than nasopharyngeal swab, presence of viral mutation(s) within the areas targeted by this assay, and inadequate number of viral copies(<138 copies/mL). A negative result must be combined with clinical observations, patient history, and  epidemiological information. The expected result is Negative.  Fact Sheet for Patients:  BloggerCourse.com  Fact Sheet for Healthcare Providers:  SeriousBroker.it  This test is no t yet approved or cleared by the [place] FDA and  has been authorized for detection and/or diagnosis of SARS-CoV-2 by FDA under an Emergency Use Authorization (EUA). This EUA will remain  in effect (meaning this test can be used) for the duration of the COVID-19 declaration under Section 564(b)(1) of the Act, 21 U.S.C.section 360bbb-3(b)(1), unless the authorization is terminated  or revoked sooner.       Influenza A by PCR NEGATIVE NEGATIVE Final   Influenza B by PCR NEGATIVE NEGATIVE Final    Comment: (NOTE) The Xpert Xpress SARS-CoV-2/FLU/RSV plus assay is intended as an aid in the diagnosis of influenza from Nasopharyngeal swab specimens and should not be used as a sole basis for treatment. Nasal washings and aspirates are unacceptable for Xpert Xpress SARS-CoV-2/FLU/RSV testing.  Fact Sheet for Patients: BloggerCourse.com  Fact Sheet for Healthcare Providers: SeriousBroker.it  This test is not yet approved or cleared by the [place] FDA and has been authorized for detection and/or diagnosis of SARS-CoV-2 by FDA under an Emergency Use Authorization (EUA). This EUA will remain in effect (meaning this test can be used) for the duration of the COVID-19 declaration under Section 564(b)(1) of the Act, 21 U.S.C. section 360bbb-3(b)(1), unless the authorization is terminated or revoked.  Performed at [facility], [address], [place], [place] [address]   Aerobic/Anaerobic Culture (surgical/deep wound)     Status: None (Preliminary result)   Collection Time: 11/06/20  4:02 PM   Specimen: Abscess  Result Value Ref Range Status   Specimen Description ABSCESS RIGHT ABDOMEN  Final    Special Requests DRAINAGE  Final   Gram Stain   Final    FEW WBC PRESENT,BOTH PMN AND MONONUCLEAR FEW GRAM NEGATIVE RODS RARE GRAM POSITIVE COCCI Performed at [facility], [address] [place], [place] [address]    Culture ABUNDANT ESCHERICHIA COLI  Final   Report Status PENDING  Incomplete         Radiology Studies: CT IMAGE GUIDED DRAINAGE BY PERCUTANEOUS CATHETER  Result Date: 11/06/2020 INDICATION: Postop appendectomy, right abdominal mesenteric abscess EXAM: CT GUIDED DRAINAGE OF THE RIGHT ABDOMINAL MESENTERIC ABSCESS MEDICATIONS: The patient is currently admitted to the hospital and receiving intravenous antibiotics.  The antibiotics were administered within an appropriate time frame prior to the initiation of the procedure. ANESTHESIA/SEDATION: Fentanyl 50 mcg IV; Versed 1.0 mg IV Moderate Sedation Time:  13 MINUTES The patient was continuously monitored during the procedure by the interventional radiology nurse under my direct supervision. COMPLICATIONS: None immediate. PROCEDURE: Informed written consent was obtained from the patient after a thorough discussion of the procedural risks, benefits and alternatives. All questions were addressed. Maximal Sterile Barrier Technique was utilized including caps, mask, sterile gowns, sterile gloves, sterile drape, hand hygiene and skin antiseptic. A timeout was performed prior to the initiation of the procedure. Previous imaging reviewed. Patient positioned supine. Noncontrast localization CT performed. The right abdominal mesenteric enlarging abscess was localized and marked for a lateral approach. Under sterile conditions and local anesthesia, an 18 gauge 15 cm access needle was advanced percutaneously into the abscess. Needle position confirmed with CT. Syringe aspiration yielded purulent fluid. Guidewire inserted followed by tract dilatation insert a 10 French drain. Drain catheter position confirmed with CT. 60 cc purulent fluid  aspirated. Sample sent for culture. Catheter secured with a Prolene suture and connected to external suction bulb. Sterile dressing applied. No immediate complication. Patient tolerated the procedure well. IMPRESSION: Successful CT-guided right abdominal mesenteric abscess drain placement. Electronically Signed   By: Judie Petit.  Shick M.D.   On: 11/06/2020 16:45        Scheduled Meds: . ALPRAZolam  0.5 mg Oral BID  . bisacodyl  10 mg Rectal Daily  . docusate sodium  100 mg Oral BID  . enoxaparin (LOVENOX) injection  40 mg Subcutaneous Q24H  . levothyroxine  112 mcg Oral QAC breakfast  . pantoprazole (PROTONIX) IV  40 mg Intravenous QHS  . QUEtiapine  600 mg Oral QHS  . venlafaxine XR  150 mg Oral Q breakfast   Continuous Infusions: . dextrose 5 % and 0.45 % NaCl with KCl 20 mEq/L 70 mL/hr at 11/07/20 1847  . piperacillin-tazobactam (ZOSYN)  IV 3.375 g (11/08/20 0642)     LOS: 7 days    Time spent:40 min    Samanthamarie Ezzell, Roselind Messier, MD [facility] Pager [phone]  If 7PM-7AM, please contact night-coverage [email or web address] Password [facility] 11/08/2020, 9:06 AM

## 2020-11-08 NOTE — Plan of Care (Signed)

## 2020-11-09 ENCOUNTER — Other Ambulatory Visit (HOSPITAL_COMMUNITY): Payer: Self-pay | Admitting: Internal Medicine

## 2020-11-09 LAB — AEROBIC/ANAEROBIC CULTURE W GRAM STAIN (SURGICAL/DEEP WOUND)

## 2020-11-09 LAB — LIPID PANEL
Cholesterol: 137 mg/dL (ref 0–200)
HDL: 18 mg/dL — ABNORMAL LOW (ref >40)
LDL Cholesterol: 82 mg/dL (ref 0–99)
Total CHOL/HDL Ratio: 7.6 RATIO
Triglycerides: 183 mg/dL — ABNORMAL HIGH (ref <150)
VLDL: 37 mg/dL (ref 0–40)

## 2020-11-09 LAB — CBC WITH DIFFERENTIAL/PLATELET
Abs Immature Granulocytes: 0.58 10*3/uL — ABNORMAL HIGH (ref 0.00–0.07)
Basophils Absolute: 0.1 10*3/uL (ref 0.0–0.1)
Basophils Relative: 1 %
Eosinophils Absolute: 0.2 10*3/uL (ref 0.0–0.5)
Eosinophils Relative: 1 %
HCT: 29.7 % — ABNORMAL LOW (ref 36.0–46.0)
Hemoglobin: 10.1 g/dL — ABNORMAL LOW (ref 12.0–15.0)
Immature Granulocytes: 5 %
Lymphocytes Relative: 19 %
Lymphs Abs: 2.4 10*3/uL (ref 0.7–4.0)
MCH: 31.4 pg (ref 26.0–34.0)
MCHC: 34 g/dL (ref 30.0–36.0)
MCV: 92.2 fL (ref 80.0–100.0)
Monocytes Absolute: 0.6 10*3/uL (ref 0.1–1.0)
Monocytes Relative: 5 %
Neutro Abs: 8.6 10*3/uL — ABNORMAL HIGH (ref 1.7–7.7)
Neutrophils Relative %: 69 %
Platelets: 382 10*3/uL (ref 150–400)
RBC: 3.22 MIL/uL — ABNORMAL LOW (ref 3.87–5.11)
RDW: 14.7 % (ref 11.5–15.5)
WBC: 12.4 10*3/uL — ABNORMAL HIGH (ref 4.0–10.5)
nRBC: 0 % (ref 0.0–0.2)

## 2020-11-09 LAB — COMPREHENSIVE METABOLIC PANEL
ALT: 14 U/L (ref 0–44)
AST: 18 U/L (ref 15–41)
Albumin: 2.1 g/dL — ABNORMAL LOW (ref 3.5–5.0)
Alkaline Phosphatase: 66 U/L (ref 38–126)
Anion gap: 9 (ref 5–15)
BUN: <5 mg/dL — ABNORMAL LOW (ref 6–20)
CO2: 22 mmol/L (ref 22–32)
Calcium: 8.3 mg/dL — ABNORMAL LOW (ref 8.9–10.3)
Chloride: 107 mmol/L (ref 98–111)
Creatinine, Ser: 0.92 mg/dL (ref 0.44–1.00)
GFR, Estimated: >60 mL/min (ref >60)
Glucose, Bld: 103 mg/dL — ABNORMAL HIGH (ref 70–99)
Potassium: 4.2 mmol/L (ref 3.5–5.1)
Sodium: 138 mmol/L (ref 135–145)
Total Bilirubin: 0.5 mg/dL (ref 0.3–1.2)
Total Protein: 6.3 g/dL — ABNORMAL LOW (ref 6.5–8.1)

## 2020-11-09 LAB — MAGNESIUM: Magnesium: 1.9 mg/dL (ref 1.7–2.4)

## 2020-11-09 LAB — PHOSPHORUS: Phosphorus: 4 mg/dL (ref 2.5–4.6)

## 2020-11-09 MED ORDER — LISINOPRIL-HYDROCHLOROTHIAZIDE 20-12.5 MG PO TABS: 1.0000 | ORAL_TABLET | Freq: Every day | ORAL | Status: DC

## 2020-11-09 MED ORDER — IBUPROFEN 800 MG PO TABS: 800.0000 mg | ORAL_TABLET | Freq: Four times a day (QID) | ORAL | 0 refills | Status: DC | PRN

## 2020-11-09 MED ORDER — CEFDINIR 300 MG PO CAPS: 300.0000 mg | ORAL_CAPSULE | Freq: Two times a day (BID) | ORAL | 0 refills | Status: DC

## 2020-11-09 MED ORDER — METRONIDAZOLE 500 MG PO TABS: 500.0000 mg | ORAL_TABLET | Freq: Three times a day (TID) | ORAL | 0 refills | Status: DC

## 2020-11-09 MED ORDER — LISINOPRIL 20 MG PO TABS
20.0000 mg | ORAL_TABLET | Freq: Every day | ORAL | Status: DC
  Administered 2020-11-09: 13:00:00 20 mg via ORAL
  Filled 2020-11-09: qty 1

## 2020-11-09 MED ORDER — SIMETHICONE 80 MG PO CHEW: 40.0000 mg | CHEWABLE_TABLET | Freq: Four times a day (QID) | ORAL | 0 refills | Status: DC | PRN

## 2020-11-09 MED ORDER — SACCHAROMYCES BOULARDII 250 MG PO CAPS: 250.0000 mg | ORAL_CAPSULE | Freq: Two times a day (BID) | ORAL | 0 refills | Status: DC

## 2020-11-09 MED ORDER — METRONIDAZOLE 500 MG PO TABS
500.0000 mg | ORAL_TABLET | Freq: Three times a day (TID) | ORAL | Status: DC
  Administered 2020-11-09: 13:00:00 500 mg via ORAL
  Filled 2020-11-09: qty 1

## 2020-11-09 MED ORDER — TRAMADOL HCL 50 MG PO TABS
50.0000 mg | ORAL_TABLET | Freq: Four times a day (QID) | ORAL | 0 refills | Status: DC | PRN
Start: 2020-11-09 — End: 2020-11-09

## 2020-11-09 MED ORDER — SIMVASTATIN 40 MG PO TABS: 40.0000 mg | ORAL_TABLET | Freq: Every day | ORAL | 0 refills | Status: DC

## 2020-11-09 MED ORDER — CEFDINIR 300 MG PO CAPS
300.0000 mg | ORAL_CAPSULE | Freq: Two times a day (BID) | ORAL | Status: DC
  Administered 2020-11-09: 12:00:00 300 mg via ORAL
  Filled 2020-11-09 (×2): qty 1

## 2020-11-09 MED ORDER — HYDROCHLOROTHIAZIDE 12.5 MG PO CAPS
12.5000 mg | ORAL_CAPSULE | Freq: Every day | ORAL | Status: DC
Start: 2020-11-09 — End: 2020-11-09
  Administered 2020-11-09: 12:00:00 12.5 mg via ORAL
  Filled 2020-11-09: qty 1

## 2020-11-09 MED ORDER — SACCHAROMYCES BOULARDII 250 MG PO CAPS
250.0000 mg | ORAL_CAPSULE | Freq: Two times a day (BID) | ORAL | Status: DC
  Administered 2020-11-09: 13:00:00 250 mg via ORAL
  Filled 2020-11-09: qty 1

## 2020-11-09 MED ORDER — TRAMADOL HCL 50 MG PO TABS: 50.0000 mg | ORAL_TABLET | Freq: Four times a day (QID) | ORAL | 0 refills | Status: DC | PRN

## 2020-11-09 MED ORDER — SIMVASTATIN 20 MG PO TABS: 40.0000 mg | ORAL_TABLET | Freq: Every day | ORAL | Status: DC

## 2020-11-09 MED ORDER — SIMVASTATIN 20 MG PO TABS: 20.0000 mg | ORAL_TABLET | Freq: Every day | ORAL | Status: DC

## 2020-11-09 MED ORDER — ACETAMINOPHEN 500 MG PO TABS: 1000.0000 mg | ORAL_TABLET | Freq: Four times a day (QID) | ORAL | 0 refills | Status: DC | PRN

## 2020-11-09 MED FILL — FLORASTOR 250 MG CAPSULE: 250 | 30 days supply | Qty: 60 | Fill #0

## 2020-11-09 MED FILL — MI-ACID GAS 80 MG TAB CHEW: 80 | 3 days supply | Qty: 30 | Fill #0

## 2020-11-09 MED FILL — ACETAMINOPHEN 500MG XT STRE: 500 | 3 days supply | Qty: 30 | Fill #0

## 2020-11-09 MED FILL — traMADol HCL 50 MG TABS: 50 | 5 days supply | Qty: 20 | Fill #0

## 2020-11-09 MED FILL — metroNIDAZOLE 500 MG TABS: 500 | 10 days supply | Qty: 30 | Fill #0

## 2020-11-09 MED FILL — CEFDINIR 300 MG CAPSULE: 300 | 10 days supply | Qty: 20 | Fill #0

## 2020-11-09 MED FILL — SIMVASTATIN 40 MG TABLET: 40 | 30 days supply | Qty: 30 | Fill #0

## 2020-11-09 MED FILL — IBUPROFEN 800 MG TAB: 800 | 7 days supply | Qty: 30 | Fill #0

## 2020-11-09 NOTE — Progress Notes (Signed)
Pt given discharge instructions and gone over with her and mother present. All questions answered. Mother shown how to flush right JP drain and empty it per pt request. She verbalized understanding. Pt belongings gathered to be sent home. Medication delivered to room from [facility] pharmacy. Pt in no distress at discharge.

## 2020-11-09 NOTE — Discharge Instructions (Signed)
Flush right drain daily with 5-10 mL sterile saline. Change dressing/bandage as needed if soiled/wet. Record output daily and bring log with you to follow-up appointments. You will see Interventional Radiology in clinic in 7-10 days for follow up CT/possible drain injection. Schedulers will contact you with date and time of appointment.   Percutaneous Abscess Drain, Care After This sheet gives you information about how to care for yourself after your procedure. Your health care provider may also give you more specific instructions. If you have problems or questions, contact your health care provider. What can I expect after the procedure? After your procedure, it is common to have:  A small amount of bruising and discomfort in the area where the drainage tube (catheter) was placed.  Sleepiness and fatigue. This should go away after the medicines you were given have worn off. Follow these instructions at home: Incision care  Follow instructions from your health care provider about how to take care of your incision. Make sure you: ? Wash your hands with soap and water before you change your bandage (dressing). If soap and water are not available, use hand sanitizer. ? Change your dressing as told by your health care provider. ? Leave stitches (sutures), skin glue, or adhesive strips in place. These skin closures may need to stay in place for 2 weeks or longer. If adhesive strip edges start to loosen and curl up, you may trim the loose edges. Do not remove adhesive strips completely unless your health care provider tells you to do that.  Check your incision area every day for signs of infection. Check for: ? More redness, swelling, or pain. ? More fluid or blood. ? Warmth. ? Pus or a bad smell. ? Fluid leaking from around your catheter (instead of fluid draining through your catheter). Catheter care   Follow instructions from your health care provider about emptying and cleaning your  catheter and collection bag. You may need to clean the catheter every day so it does not clog.  If directed, write down the following information every time you empty your bag: ? The date and time. ? The amount of drainage. General instructions  Rest at home for 1-2 days after your procedure. Return to your normal activities as told by your health care provider.  Do not take baths, swim, or use a hot tub for 24 hours after your procedure, or until your health care provider says that this is okay.  Take over-the-counter and prescription medicines only as told by your health care provider.  Keep all follow-up visits as told by your health care provider. This is important. Contact a health care provider if:  You have less than 10 mL of drainage a day for 2-3 days in a row, or as directed by your health care provider.  You have more redness, swelling, or pain around your incision area.  You have more fluid or blood coming from your incision area.  Your incision area feels warm to the touch.  You have pus or a bad smell coming from your incision area.  You have fluid leaking from around your catheter (instead of through your catheter).  You have a fever or chills.  You have pain that does not get better with medicine. Get help right away if:  Your catheter comes out.  You suddenly stop having drainage from your catheter.  You suddenly have blood in the fluid that is draining from your catheter.  You become dizzy or you faint.  You  develop a rash.  You have nausea or vomiting.  You have difficulty breathing or you feel short of breath.  You develop chest pain.  You have problems with your speech or vision.  You have trouble balancing or moving your arms or legs. Summary  It is common to have a small amount of bruising and discomfort in the area where the drainage tube (catheter) was placed.  You may be directed to record the amount of drainage from the bag every time  you empty it.  Follow instructions from your health care provider about emptying and cleaning your catheter and collection bag. This information is not intended to replace advice given to you by your health care provider. Make sure you discuss any questions you have with your health care provider. Document Revised: 10/17/2017 Document Reviewed: 09/26/2016 Elsevier Patient Education  2020 [employer].

## 2020-11-09 NOTE — Progress Notes (Signed)
[facility] Progress Note     Subjective: CC-  Feels well today. Asking when she can go home. Denies abdominal pain, n/v. Tolerating diet. BM yesterday. WBC continues to trend down 12.4, afebrile  Objective: Vital signs in last 24 hours: Temp:  [98.4 F (36.9 C)-99.1 F (37.3 C)] 98.7 F (37.1 C) (12/23 0731) Pulse Rate:  [54-66] 59 (12/23 0731) Resp:  [15-17] 17 (12/23 0731) BP: (160-172)/(91-97) 160/97 (12/23 0731) SpO2:  [97 %-100 %] 97 % (12/23 0731) Last BM Date: 11/09/20  Intake/Output from previous day: 12/22 0701 - 12/23 0700 In: 10  Out: 45 [Drains:45] Intake/Output this shift: No intake/output data recorded.  PE: Gen:  Alert, NAD, pleasant Pulm:  rate and effort normal Abd: Soft, NT/ND, +BS, lap incision cdi (staples removed), LLQ drain serous, RLQ clearing up but still purulent  Lab Results:  Recent Labs    11/08/20 0304 11/09/20 0146  WBC 12.7* 12.4*  HGB 10.4* 10.1*  HCT 31.2* 29.7*  PLT 374 382   BMET Recent Labs    11/08/20 0304 11/09/20 0146  NA 138 138  K 4.0 4.2  CL 107 107  CO2 23 22  GLUCOSE 107* 103*  BUN <5* <5*  CREATININE 0.90 0.92  CALCIUM 8.2* 8.3*   PT/INR No results for input(s): LABPROT, INR in the last 72 hours. CMP     Component Value Date/Time   NA 138 11/09/2020 0146   K 4.2 11/09/2020 0146   CL 107 11/09/2020 0146   CO2 22 11/09/2020 0146   GLUCOSE 103 (H) 11/09/2020 0146   BUN <5 (L) 11/09/2020 0146   CREATININE 0.92 11/09/2020 0146   CALCIUM 8.3 (L) 11/09/2020 0146   PROT 6.3 (L) 11/09/2020 0146   ALBUMIN 2.1 (L) 11/09/2020 0146   AST 18 11/09/2020 0146   ALT 14 11/09/2020 0146   ALKPHOS 66 11/09/2020 0146   BILITOT 0.5 11/09/2020 0146   GFRNONAA >60 11/09/2020 0146   GFRAA >60 05/06/2019 0812   Lipase  No results found for: LIPASE     Studies/Results: No results found.  Anti-infectives: Anti-infectives (From admission, onward)   Start     Dose/Rate Route Frequency Ordered Stop    11/05/20 1715  piperacillin-tazobactam (ZOSYN) IVPB 3.375 g        3.375 g 12.5 mL/hr over 240 Minutes Intravenous Every 8 hours 11/05/20 1621     11/01/20 1130  cefoTEtan (CEFOTAN) 2 g in sodium chloride 0.9 % 100 mL IVPB  Status:  Discontinued        2 g 200 mL/hr over 30 Minutes Intravenous Every 12 hours 11/01/20 1115 11/05/20 1621       Assessment/Plan HTN HLD Hypothyroidism Obesity BMI 36 Bipolar, depression, anxiety Tobacco abuse - previously smoked 1 PPD x30 years, quit smoking 2 months ago  Perforated gangrenous appendicitis -S/p laparoscopic appendectomy with JP drain placement 10/30/20 by Dr. Lovell Sheehan Multiple postoperative intraabdominal abscesses - noted on CT 12/18 -S/p IR drain placement right abdominal abscess - cx E coli susceptible to pip/tazo but resisteant to ampicillin - WBC down, afebrile, tolerating diet and having bowel function - Ok for discharge home with drains from surgical standpoint. Patient knows how to flush the IR drain. Recommend 10 days cefdinir/flagyl at discharge. I started her on a probiotic. Follow up with IR and Dr. Lovell Sheehan.  ID - cefotetan 12/15>>12/19, zosyn 12/19>>day#5 VTE - SCDs, lovenox FEN - reg diet Foley - none Follow up - Dr. Lovell Sheehan, IR   LOS: 8 days  Franne Forts, PA-C [facility] 11/09/2020, 11:01 AM Please see Amion for pager number during day hours 7:00am-4:30pm

## 2020-11-09 NOTE — Plan of Care (Signed)
°  Problem: Health Behavior/Discharge Planning: Goal: Ability to manage health-related needs will improve Outcome: Adequate for Discharge   Problem: Activity: Goal: Risk for activity intolerance will decrease Outcome: Adequate for Discharge   Problem: Elimination: Goal: Will not experience complications related to bowel motility Outcome: Adequate for Discharge

## 2020-11-09 NOTE — Discharge Summary (Signed)
Physician Discharge Summary  Evelyn Simpson IHK:[id number] DOB: 09-24-1976 DOA: 10/31/2020  PCP: Rebecka Apley, NP  Admit date: 10/31/2020 Discharge date: 11/09/2020  Time spent: 35 minutes  Recommendations for Outpatient Follow-up:   Covid vaccination; vaccinated   Principal Problem:   Ileus, postoperative [facility]) Active Problems:   Hypokalemia   Intra-abdominal abscess (HCC)  Multiloculated abscesses in abdomen -Bilateral JP drains in place in the abdomen. -Repeat CT scan 12/18 shows a small collection posterior to bowel loop 3 x 1.2 cm and another 6.9 x 3.3 cm-JP drain not in place where these abscesses are -CT guided drain placed 12/20 with 60 cc aspirated; cultures pending -Continue current antibiotics per surgery; per surgery at discharge patient is to continue antibiotics for 10 more days post discharge.  (Cefdinir + Flagyl) -12/13 s/p appendectomy drain placement -Follow-up with Dr. Franky Macho and Dr. Rudean Hitt per their instructions in AVS.  Bipolar -Seroquel 600 mg daily -Pristiq 150 mg daily -Hydroxyzine, Ativan (hold)  Essential HTN -Patient's BP has increased so we will discharge on her home BP medication  HLD -12/23 LDL = 82: LDL goal<70 -Increase Simvastatin 40 mg daily    Hypothyroidism -Synthroid 112 mcg daily    Discharge Diagnoses:  Principal Problem:   Ileus, postoperative (HCC) Active Problems:   Hypokalemia   Intra-abdominal abscess [facility])   Discharge Condition: Stable  Diet recommendation: Regular fluid consistency thin  Filed Weights   10/31/20 2203 11/02/20 0940  Weight: 97.5 kg 95.2 kg    History of present illness:  44 year old white female Bipolar 1 disorder, Depression, ADD, HTN, Hypothyroid, HLD, Hypothyroidism Status post lap appendectomy drain placement 12/13 discharged home Developed postop ileus and returned same day Because of increasing leukocytosis-it was felt she may have advancing infection CT  performed showed multiple rim-enhancing fluid collections JP drain right lower quadrant General surgery [facility] discussed with IR planning-patient was kept n.p.o. placed on IV cefotetan which was broadened to Zosyn Currently awaiting IRinput who performed CT guided abscess drain and general surgery was consulted  laparoscopic appendectomy with drain placement for perforated appendicitis on 12/13 and was d/c home on 12/14. She returned to the ED that evening with nausea and vomiting. She vomited multiple times and was unable to keep anything down. She had a Xray that demonstrated post operative ileus. She continues to have vomiting. She had NG attempted X 4 and is willing to try again.  Hospital Course:  See above  Procedures: 12/13 s/p laparoscopic appendectomy with JP drain placement by Dr. Lovell Sheehan 12/20 RLQ drain placed by IR   Consultations: [facility] IR  Cultures  12/12 SARS coronavirus negative 12/12 influenza A/B negative 12/13 MRSA by PCR negative 12/14 blood negative final 12/20 Abscess RIGHT abdomen positive E. coli resistant ampicillin/Augmentin   Antibiotics Anti-infectives (From admission, onward)   Start     Ordered Stop   11/09/20 1400  metroNIDAZOLE (FLAGYL) tablet 500 mg        11/09/20 1122     11/09/20 1215  cefdinir (OMNICEF) capsule 300 mg        11/09/20 1122     11/05/20 1715  piperacillin-tazobactam (ZOSYN) IVPB 3.375 g  Status:  Discontinued        11/05/20 1621 11/09/20 1122   11/01/20 1130  cefoTEtan (CEFOTAN) 2 g in sodium chloride 0.9 % 100 mL IVPB  Status:  Discontinued        11/01/20 1115 11/05/20 1621       Discharge Exam: Vitals:  11/08/20 1546 11/08/20 2054 11/09/20 0402 11/09/20 0731  BP: (!) 168/93 (!) 172/91 (!) 170/91 (!) 160/97  Pulse: 60 (!) 54 66 (!) 59  Resp: 16 15 16 17   Temp: 98.5 F (36.9 C) 99.1 F (37.3 C) 98.4 F (36.9 C) 98.7 F (37.1 C)  TempSrc: Oral Oral Oral Oral  SpO2: 99% 100% 100% 97%   Weight:      Height:        General: A/O x4, No acute respiratory distress Eyes: negative scleral hemorrhage, negative anisocoria, negative icterus ENT: Negative Runny nose, negative gingival bleeding, Neck:  Negative scars, masses, torticollis, lymphadenopathy, JVD Lungs: Clear to auscultation bilaterally without wheezes or crackles Cardiovascular: Regular rate and rhythm without murmur gallop or rub normal S1 and S2   Discharge Instructions   Allergies as of 11/09/2020   No Known Allergies     Medication List    STOP taking these medications   ALPRAZolam 1 MG 24 hr tablet Commonly known as: XANAX XR   ALPRAZolam 1 MG tablet Commonly known as: XANAX   amoxicillin-clavulanate 875-125 MG tablet Commonly known as: Augmentin   hydrOXYzine 25 MG capsule Commonly known as: VISTARIL   Potassium Chloride ER 20 MEQ Tbcr     TAKE these medications   acetaminophen 500 MG tablet Commonly known as: TYLENOL Take 2 tablets (1,000 mg total) by mouth every 6 (six) hours as needed for fever, headache or mild pain.   cefdinir 300 MG capsule Commonly known as: OMNICEF Take 1 capsule (300 mg total) by mouth every 12 (twelve) hours.   desvenlafaxine 100 MG 24 hr tablet Commonly known as: PRISTIQ Take 100 mg by mouth 2 (two) times daily.   ibuprofen 800 MG tablet Commonly known as: ADVIL Take 1 tablet (800 mg total) by mouth every 6 (six) hours as needed for mild pain (not relieved by tylenol).   levothyroxine 112 MCG tablet Commonly known as: SYNTHROID Take 112 mcg by mouth daily before breakfast.   Linzess 290 MCG Caps capsule Generic drug: linaclotide TAKE ONE (1) CAPSULE EACH DAY What changed: See the new instructions.   lisinopril-hydrochlorothiazide 20-12.5 MG tablet Commonly known as: ZESTORETIC Take 1 tablet by mouth daily.   metroNIDAZOLE 500 MG tablet Commonly known as: FLAGYL Take 1 tablet (500 mg total) by mouth every 8 (eight) hours.   ondansetron 8 MG  disintegrating tablet Commonly known as: ZOFRAN-ODT Take 8 mg by mouth 3 (three) times daily.   oxyCODONE-acetaminophen 5-325 MG tablet Commonly known as: Percocet Take 1 tablet by mouth every 4 (four) hours as needed for severe pain.   QUEtiapine 300 MG tablet Commonly known as: SEROQUEL Take 600 mg by mouth at bedtime.   saccharomyces boulardii 250 MG capsule Commonly known as: FLORASTOR Take 1 capsule (250 mg total) by mouth 2 (two) times daily.   simethicone 80 MG chewable tablet Commonly known as: MYLICON Chew 0.5 tablets (40 mg total) by mouth every 6 (six) hours as needed for flatulence (bloating).   simvastatin 40 MG tablet Commonly known as: ZOCOR Take 1 tablet (40 mg total) by mouth daily at 6 PM. What changed:   medication strength  how much to take  when to take this   traMADol 50 MG tablet Commonly known as: ULTRAM Take 1 tablet (50 mg total) by mouth every 6 (six) hours as needed for moderate pain.   Vitamin D (Ergocalciferol) 1.25 MG (50000 UNIT) Caps capsule Commonly known as: DRISDOL Take 50,000 Units by mouth once a week.  Saturdays      No Known Allergies  Follow-up Information    Berdine Dance, MD Follow up.   Specialties: Interventional Radiology, Radiology Why: Schedulers will call you with date and time of follow-up appointment.  Contact information: [address] [place] [address] [phone]        Franky Macho, MD Follow up.   Specialty: General Surgery Contact information: [address] [place] [address] [phone]                The results of significant diagnostics from this hospitalization (including imaging, microbiology, ancillary and laboratory) are listed below for reference.    Significant Diagnostic Studies: DG Abd 1 View  Result Date: 11/02/2020 CLINICAL DATA:  Recent appendectomy.  Abdominal pain EXAM: ABDOMEN - 1 VIEW COMPARISON:  11/01/2020 FINDINGS: Distended small  bowel loops appears slightly improved in the interval. Colon decompressed. IUD noted in the pelvis. IMPRESSION: Mild improvement in small bowel dilatation.  Colon decompressed. Electronically Signed   By: Marlan Palau M.D.   On: 11/02/2020 10:32   CT ABDOMEN PELVIS W CONTRAST  Result Date: 11/04/2020 CLINICAL DATA:  Status post laparoscopic appendectomy. Persistent leukocytosis. EXAM: CT ABDOMEN AND PELVIS WITH CONTRAST TECHNIQUE: Multidetector CT imaging of the abdomen and pelvis was performed using the standard protocol following bolus administration of intravenous contrast. CONTRAST:  OMNIPAQUE IOHEXOL 300 MG/ML  SOLN COMPARISON:  October 29, 2020. FINDINGS: Lower chest: Small bilateral pleural effusions. Bibasilar enhancing opacities most consistent with atelectasis. Hepatobiliary: No focal liver abnormality is seen. No gallstones, gallbladder wall thickening, or biliary dilatation. Pancreas: Unremarkable. No pancreatic ductal dilatation or surrounding inflammatory changes. Spleen: Normal in size without focal abnormality. Adrenals/Urinary Tract: Adrenal glands are unremarkable. Kidneys are normal, without renal calculi, focal lesion, or hydronephrosis. Bladder is unremarkable. Stomach/Bowel: Status post appendectomy. Small amount of bowel wall thickening of the cecum, likely reactive. There are several rim enhancing fluid collections throughout the abdomen. There is a rim enhancing fluid collection within the upper abdomen which spans 6.9 by 3.3 cm (series 2, image 46). There is fat stranding adjacent to this collection. There is a tubular collection with a small focus of air in the RIGHT lower quadrant immediately adjacent to small bowel which spans 6.5 x 2.6 cm (series 2, image 62). There is a small collection posterior to a loop of bowel which measures 3.1 x 2.3 cm, previously 2.0 x 1.3 cm (series 2, image 54). These 3 collections may be partially contiguous. There is a small amount of fluid  within the RIGHT pelvis with faint adjacent enhancement. Small amount of rounded fluid adjacent to the LEFT rectum measures 1.7 cm. There is a small amount of free fluid overlying the liver, within the LEFT hemiabdomen and overlying the spleen. JP drain is in the RIGHT lower quadrant and is not immediately adjacent to any of the described collections. There is mild dilation of the proximal small bowel. Enteric contrast has progressed to the proximal small bowel. Vascular/Lymphatic: Atherosclerotic calcifications of the aorta. No suspicious lymphadenopathy. Mildly prominent mesenteric lymph nodes are likely reactive. Reproductive: IUD is present within the uterus. Tampon. No suspicious adnexal masses. Other: Small amount of air within the anterior subcutaneous tissues consistent with surgical intervention. Musculoskeletal: Chronic mild vertebral body height loss of L3. No acute osseous abnormality. IMPRESSION: 1. Multiple rim enhancing fluid collections throughout the abdomen and pelvis, some of which may be partially contiguous. Findings are concerning for abscesses given history of gangrenous appendicitis. 2.  JP-drain is in the RIGHT lower quadrant and is not immediately adjacent to any of the above described collections. 3. Mild dilation of the proximal small bowel which is favored to reflect ileus. Enteric contrast has progressed to the proximal small bowel. 4. Small bilateral pleural effusions. Aortic Atherosclerosis (ICD10-I70.0). Electronically Signed   By: Meda Klinefelter MD   On: 11/04/2020 15:44   CT Abdomen Pelvis W Contrast  Result Date: 10/29/2020 CLINICAL DATA:  Acute abdominal pain with nausea EXAM: CT ABDOMEN AND PELVIS WITH CONTRAST TECHNIQUE: Multidetector CT imaging of the abdomen and pelvis was performed using the standard protocol following bolus administration of intravenous contrast. CONTRAST:  OMNIPAQUE IOHEXOL 300 MG/ML  SOLN COMPARISON:  None. FINDINGS: Lower chest: Bibasilar  and lingula atelectasis. No pleural or pericardial effusion. Normal heart size. Hepatobiliary: No focal hepatic abnormality or biliary dilatation. Hepatic and portal veins are patent. Gallbladder and biliary tree unremarkable. Common bile duct nondilated. Trace perihepatic fluid along the right inferior liver. Pancreas: Unremarkable. No pancreatic ductal dilatation or surrounding inflammatory changes. Spleen: No focal abnormality. Normal size. Trace left upper quadrant subdiaphragmatic and perisplenic free fluid. Adrenals/Urinary Tract: Normal adrenal glands. Small bilateral subcentimeter renal cysts. No renal obstruction or hydronephrosis. No hydroureter or ureteral calculus. Bladder unremarkable. Stomach/Bowel: Negative for bowel obstruction, significant dilatation, or free air. In the right lower quadrant, the appendix is dilated with fluid distension, mucosal enhancement and small radiodense appendicoliths compatible with appendicitis. Additionally, several loops of mid and distal small bowel demonstrate slight wall thickening and mucosal enhancement. Similar wall thickening and enhancement of the cecum. Central mesentery demonstrates diffuse edema/inflammation with a few scattered small areas of mesenteric interloop free fluid. This remains nonspecific but suggest a secondary associated enterocolitis/peritonitis pattern. Vascular/Lymphatic: Intact aorta. Infrarenal atherosclerotic change. No aneurysm or occlusive process. No retroperitoneal hemorrhage or hematoma. Mesenteric and renal vasculature appear to remain patent. No veno-occlusive process. No bulky adenopathy. Reproductive: IUD within the midline of the uterus endometrial cavity. Uterus and adnexa normal in size. Trace pelvic free fluid as well. Other: Intact abdominal wall. Musculoskeletal: Degenerative changes of the spine. Lower lumbar facet arthropathy. No acute osseous finding. IMPRESSION: Acute appendicitis with associated appendicoliths. Diffuse  mid and distal small bowel and cecal wall thickening with mucosal enhancement as well as central mesentery edema and scattered areas of mesenteric interloop free fluid. Appearance is compatible with associated enterocolitis/peritonitis. No discernible abscess, free air or evidence of perforation. These results were called by telephone at the time of interpretation on 10/29/2020 at 2:27 pm to provider [facility] , who verbally acknowledged these results. Electronically Signed   By: Judie Petit.  Shick M.D.   On: 10/29/2020 14:29   DG Chest Port 1 View  Result Date: 10/31/2020 CLINICAL DATA:  Fever. Post recent appendectomy. EXAM: PORTABLE CHEST 1 VIEW COMPARISON:  None. FINDINGS: Lung volumes are low. Patchy bibasilar opacities favoring atelectasis. There may be a small left pleural effusion. No pneumothorax. The heart is normal in size with normal mediastinal contours. No acute osseous abnormalities are seen. IMPRESSION: 1. Low lung volumes with patchy bibasilar opacities, favoring atelectasis. 2. Possible small left pleural effusion. Electronically Signed   By: Narda Rutherford M.D.   On: 10/31/2020 23:13   DG Abd Portable 2 Views  Result Date: 11/01/2020 CLINICAL DATA:  Vomiting following appendectomy EXAM: PORTABLE ABDOMEN - 2 VIEW COMPARISON:  None. FINDINGS: Two view radiograph of the abdomen demonstrates multiple gas-filled mildly dilated loops of small bowel within the mid abdomen demonstrating air-fluid levels at  similar levels on upright examination most in keeping with a postoperative adynamic ileus. No free intraperitoneal gas. Surgical drainage catheter noted within the right hemipelvis. Intrauterine device in place. Mild lumbar levoscoliosis again noted IMPRESSION: Multiple gas-filled dilated loops of bowel within the mid abdomen most in keeping with an ileus. Electronically Signed   By: Helyn NumbersAshesh  Parikh MD   On: 11/01/2020 02:14   CT IMAGE GUIDED DRAINAGE BY PERCUTANEOUS CATHETER  Result Date:  11/06/2020 INDICATION: Postop appendectomy, right abdominal mesenteric abscess EXAM: CT GUIDED DRAINAGE OF THE RIGHT ABDOMINAL MESENTERIC ABSCESS MEDICATIONS: The patient is currently admitted to the hospital and receiving intravenous antibiotics. The antibiotics were administered within an appropriate time frame prior to the initiation of the procedure. ANESTHESIA/SEDATION: Fentanyl 50 mcg IV; Versed 1.0 mg IV Moderate Sedation Time:  13 MINUTES The patient was continuously monitored during the procedure by the interventional radiology nurse under my direct supervision. COMPLICATIONS: None immediate. PROCEDURE: Informed written consent was obtained from the patient after a thorough discussion of the procedural risks, benefits and alternatives. All questions were addressed. Maximal Sterile Barrier Technique was utilized including caps, mask, sterile gowns, sterile gloves, sterile drape, hand hygiene and skin antiseptic. A timeout was performed prior to the initiation of the procedure. Previous imaging reviewed. Patient positioned supine. Noncontrast localization CT performed. The right abdominal mesenteric enlarging abscess was localized and marked for a lateral approach. Under sterile conditions and local anesthesia, an 18 gauge 15 cm access needle was advanced percutaneously into the abscess. Needle position confirmed with CT. Syringe aspiration yielded purulent fluid. Guidewire inserted followed by tract dilatation insert a 10 French drain. Drain catheter position confirmed with CT. 60 cc purulent fluid aspirated. Sample sent for culture. Catheter secured with a Prolene suture and connected to external suction bulb. Sterile dressing applied. No immediate complication. Patient tolerated the procedure well. IMPRESSION: Successful CT-guided right abdominal mesenteric abscess drain placement. Electronically Signed   By: Judie PetitM.  Shick M.D.   On: 11/06/2020 16:45    Microbiology: Recent Results (from the past 240  hour(s))  Culture, blood (single) w Reflex to ID Panel     Status: None   Collection Time: 10/31/20 11:04 PM   Specimen: BLOOD  Result Value Ref Range Status   Specimen Description BLOOD  Final   Special Requests NONE  Final   Culture   Final    NO GROWTH 6 DAYS Performed at [facility], [address] [place], [place] [id number]    Report Status 11/06/2020 FINAL  Final  Resp Panel by RT-PCR (Flu A&B, Covid) Nasopharyngeal Swab     Status: None   Collection Time: 11/01/20  3:02 AM   Specimen: Nasopharyngeal Swab; Nasopharyngeal(NP) swabs in vial transport medium  Result Value Ref Range Status   SARS Coronavirus 2 by RT PCR NEGATIVE NEGATIVE Final    Comment: (NOTE) SARS-CoV-2 target nucleic acids are NOT DETECTED.  The SARS-CoV-2 RNA is generally detectable in upper respiratory specimens during the acute phase of infection. The lowest concentration of SARS-CoV-2 viral copies this assay can detect is 138 copies/mL. A negative result does not preclude SARS-Cov-2 infection and should not be used as the sole basis for treatment or other patient management decisions. A negative result may occur with  improper specimen collection/handling, submission of specimen other than nasopharyngeal swab, presence of viral mutation(s) within the areas targeted by this assay, and inadequate number of viral copies(<138 copies/mL). A negative result must be combined with clinical observations, patient history, and epidemiological information. The  expected result is Negative.  Fact Sheet for Patients:  BloggerCourse.com  Fact Sheet for Healthcare Providers:  SeriousBroker.it  This test is no t yet approved or cleared by the [place] FDA and  has been authorized for detection and/or diagnosis of SARS-CoV-2 by FDA under an Emergency Use Authorization (EUA). This EUA will remain  in effect (meaning this test can be used) for the duration of  the COVID-19 declaration under Section 564(b)(1) of the Act, 21 U.S.C.section 360bbb-3(b)(1), unless the authorization is terminated  or revoked sooner.       Influenza A by PCR NEGATIVE NEGATIVE Final   Influenza B by PCR NEGATIVE NEGATIVE Final    Comment: (NOTE) The Xpert Xpress SARS-CoV-2/FLU/RSV plus assay is intended as an aid in the diagnosis of influenza from Nasopharyngeal swab specimens and should not be used as a sole basis for treatment. Nasal washings and aspirates are unacceptable for Xpert Xpress SARS-CoV-2/FLU/RSV testing.  Fact Sheet for Patients: BloggerCourse.com  Fact Sheet for Healthcare Providers: SeriousBroker.it  This test is not yet approved or cleared by the [place] FDA and has been authorized for detection and/or diagnosis of SARS-CoV-2 by FDA under an Emergency Use Authorization (EUA). This EUA will remain in effect (meaning this test can be used) for the duration of the COVID-19 declaration under Section 564(b)(1) of the Act, 21 U.S.C. section 360bbb-3(b)(1), unless the authorization is terminated or revoked.  Performed at [facility], [address] [place], [place] [address]   Aerobic/Anaerobic Culture (surgical/deep wound)     Status: None (Preliminary result)   Collection Time: 11/06/20  4:02 PM   Specimen: Abscess  Result Value Ref Range Status   Specimen Description ABSCESS RIGHT ABDOMEN  Final   Special Requests DRAINAGE  Final   Gram Stain   Final    FEW WBC PRESENT,BOTH PMN AND MONONUCLEAR FEW GRAM NEGATIVE RODS RARE GRAM POSITIVE COCCI    Culture   Final    ABUNDANT ESCHERICHIA COLI CULTURE REINCUBATED FOR BETTER GROWTH Performed at [facility], [address], [place], [place] [address]    Report Status PENDING  Incomplete   Organism ID, Bacteria ESCHERICHIA COLI  Final      Susceptibility   Escherichia coli - MIC*    AMPICILLIN >=32 RESISTANT Resistant      CEFAZOLIN 16 SENSITIVE Sensitive     CEFEPIME <=0.12 SENSITIVE Sensitive     CEFTAZIDIME <=1 SENSITIVE Sensitive     CEFTRIAXONE <=0.25 SENSITIVE Sensitive     CIPROFLOXACIN <=0.25 SENSITIVE Sensitive     GENTAMICIN <=1 SENSITIVE Sensitive     IMIPENEM <=0.25 SENSITIVE Sensitive     TRIMETH/SULFA <=20 SENSITIVE Sensitive     AMPICILLIN/SULBACTAM >=32 RESISTANT Resistant     PIP/TAZO <=4 SENSITIVE Sensitive     * ABUNDANT ESCHERICHIA COLI     Labs: Basic Metabolic Panel: Recent Labs  Lab 11/05/20 0442 11/06/20 0101 11/07/20 0432 11/08/20 0304 11/09/20 0146  NA 136 138 137 138 138  K 3.5 4.0 3.5 4.0 4.2  CL 104 106 105 107 107  CO2 27 23 22 23 22   GLUCOSE 98 93 101* 107* 103*  BUN <5* 5* 5* <5* <5*  CREATININE 0.81 0.99 0.82 0.90 0.92  CALCIUM 7.8* 8.2* 8.0* 8.2* 8.3*  MG 1.9 2.0 1.8 1.8 1.9  PHOS 2.6 4.0 4.8*  --  4.0   Liver Function Tests: Recent Labs  Lab 11/07/20 0432 11/08/20 0304 11/09/20 0146  AST 12* 12* 18  ALT 14 11  14  ALKPHOS 66 67 66  BILITOT 0.4 0.7 0.5  PROT 6.2* 6.4* 6.3*  ALBUMIN 2.0* 2.0* 2.1*   No results for input(s): LIPASE, AMYLASE in the last 168 hours. No results for input(s): AMMONIA in the last 168 hours. CBC: Recent Labs  Lab 11/05/20 0442 11/06/20 0101 11/07/20 0432 11/08/20 0304 11/09/20 0146  WBC 17.2* 15.6* 15.0* 12.7* 12.4*  NEUTROABS 14.4* 11.9* 11.1* 8.7* 8.6*  HGB 10.8* 10.4* 10.5* 10.4* 10.1*  HCT 32.5* 31.4* 31.2* 31.2* 29.7*  MCV 93.4 92.9 91.8 93.7 92.2  PLT 300 317 357 374 382   Cardiac Enzymes: No results for input(s): CKTOTAL, CKMB, CKMBINDEX, TROPONINI in the last 168 hours. BNP: BNP (last 3 results) No results for input(s): BNP in the last 8760 hours.  ProBNP (last 3 results) No results for input(s): PROBNP in the last 8760 hours.  CBG: No results for input(s): GLUCAP in the last 168 hours.     Signed:  Carolyne Littles, MD [facility] [phone] pager

## 2020-11-09 NOTE — Progress Notes (Signed)
Referring Physician(s): Dr Zenon Mayo Dr Raelyn Mora  Supervising Physician: Richarda Overlie  Patient Status:  [facility] - In-pt  Chief Complaint:  Intra abd abscess post appendectomy  Subjective:  RLQ drain placed in IR 12/20 Pt also has LLQ drain from surgery  She is feeling lots better Eating "real food" Up in bed Has ambulated DC home soon per [facility] note  Allergies: Patient has no known allergies.  Medications: Prior to Admission medications   Medication Sig Start Date End Date Taking? Authorizing Provider  ALPRAZolam (XANAX XR) 1 MG 24 hr tablet Take 1 mg by mouth every morning.    Yes [provider]  ALPRAZolam Prudy Feeler) 1 MG tablet Take 1 tablet by mouth at bedtime as needed.   Yes [provider]  desvenlafaxine (PRISTIQ) 100 MG 24 hr tablet Take 100 mg by mouth 2 (two) times daily.    Yes [provider]  hydrOXYzine (VISTARIL) 25 MG capsule Take 25 mg by mouth 3 (three) times daily as needed.   Yes [provider]  levothyroxine (SYNTHROID) 112 MCG tablet Take 112 mcg by mouth daily before breakfast.  01/19/19  Yes [provider]  LINZESS 290 MCG CAPS capsule TAKE ONE (1) CAPSULE EACH DAY Patient taking differently: Take 290 mcg by mouth daily before breakfast. 10/08/19  Yes Gelene Mink, NP  lisinopril-hydrochlorothiazide (ZESTORETIC) 20-12.5 MG tablet Take 1 tablet by mouth daily. 10/01/17  Yes [provider]  ondansetron (ZOFRAN-ODT) 8 MG disintegrating tablet Take 8 mg by mouth 3 (three) times daily. 10/28/20  Yes [provider]  oxyCODONE-acetaminophen (PERCOCET) 5-325 MG tablet Take 1 tablet by mouth every 4 (four) hours as needed for severe pain. 10/31/20 10/31/21 Yes Franky Macho, MD  QUEtiapine (SEROQUEL) 300 MG tablet Take 600 mg by mouth at bedtime.    Yes [provider]  simvastatin (ZOCOR) 20 MG tablet Take 20 mg by mouth daily.  10/01/17  Yes [provider]  Vitamin D,  Ergocalciferol, (DRISDOL) 1.25 MG (50000 UT) CAPS capsule Take 50,000 Units by mouth once a week. Saturdays 03/13/18  Yes [provider]  amoxicillin-clavulanate (AUGMENTIN) 875-125 MG tablet Take 1 tablet by mouth 2 (two) times daily. 10/31/20   Franky Macho, MD  potassium chloride 20 MEQ TBCR Take 10 mEq by mouth daily. 10/31/20   Franky Macho, MD     Vital Signs: BP (!) 160/97   Pulse (!) 59   Temp 98.7 F (37.1 C) (Oral)   Resp 17   Ht 5\' 4"  (1.626 m)   Wt 209 lb 14.1 oz (95.2 kg)   LMP 10/28/2020 Comment: Pregnancy Test Neg on 10/29/20  SpO2 97%   BMI 36.03 kg/m   Physical Exam Skin:    General: Skin is warm.     Comments: Site of RLQ abscess drain is clean and dry Sl tender No bleeding No infection OP 45 cc yesterday  Organism ID, Bacteria ESCHERICHIA COLI       Imaging: CT IMAGE GUIDED DRAINAGE BY PERCUTANEOUS CATHETER  Result Date: 11/06/2020 INDICATION: Postop appendectomy, right abdominal mesenteric abscess EXAM: CT GUIDED DRAINAGE OF THE RIGHT ABDOMINAL MESENTERIC ABSCESS MEDICATIONS: The patient is currently admitted to the hospital and receiving intravenous antibiotics. The antibiotics were administered within an appropriate time frame prior to the initiation of the procedure. ANESTHESIA/SEDATION: Fentanyl 50 mcg IV; Versed 1.0 mg IV Moderate Sedation Time:  13 MINUTES The patient was continuously monitored during the procedure by the interventional radiology nurse under my  direct supervision. COMPLICATIONS: None immediate. PROCEDURE: Informed written consent was obtained from the patient after a thorough discussion of the procedural risks, benefits and alternatives. All questions were addressed. Maximal Sterile Barrier Technique was utilized including caps, mask, sterile gowns, sterile gloves, sterile drape, hand hygiene and skin antiseptic. A timeout was performed prior to the initiation of the procedure. Previous imaging reviewed. Patient positioned  supine. Noncontrast localization CT performed. The right abdominal mesenteric enlarging abscess was localized and marked for a lateral approach. Under sterile conditions and local anesthesia, an 18 gauge 15 cm access needle was advanced percutaneously into the abscess. Needle position confirmed with CT. Syringe aspiration yielded purulent fluid. Guidewire inserted followed by tract dilatation insert a 10 French drain. Drain catheter position confirmed with CT. 60 cc purulent fluid aspirated. Sample sent for culture. Catheter secured with a Prolene suture and connected to external suction bulb. Sterile dressing applied. No immediate complication. Patient tolerated the procedure well. IMPRESSION: Successful CT-guided right abdominal mesenteric abscess drain placement. Electronically Signed   By: Judie Petit.  Shick M.D.   On: 11/06/2020 16:45    Labs:  CBC: Recent Labs    11/06/20 0101 11/07/20 0432 11/08/20 0304 11/09/20 0146  WBC 15.6* 15.0* 12.7* 12.4*  HGB 10.4* 10.5* 10.4* 10.1*  HCT 31.4* 31.2* 31.2* 29.7*  PLT 317 357 374 382    COAGS: Recent Labs    11/06/20 1025  INR 1.2    BMP: Recent Labs    11/06/20 0101 11/07/20 0432 11/08/20 0304 11/09/20 0146  NA 138 137 138 138  K 4.0 3.5 4.0 4.2  CL 106 105 107 107  CO2 23 22 23 22   GLUCOSE 93 101* 107* 103*  BUN 5* 5* <5* <5*  CALCIUM 8.2* 8.0* 8.2* 8.3*  CREATININE 0.99 0.82 0.90 0.92  GFRNONAA >60 >60 >60 >60    LIVER FUNCTION TESTS: Recent Labs    10/31/20 2317 11/07/20 0432 11/08/20 0304 11/09/20 0146  BILITOT 2.3* 0.4 0.7 0.5  AST 33 12* 12* 18  ALT 20 14 11 14   ALKPHOS 74 66 67 66  PROT 6.9 6.2* 6.4* 6.3*  ALBUMIN 3.0* 2.0* 2.0* 2.1*    Assessment and Plan:  RLQ abscess drain placed in IR 12/20 Plan per [facility] Will follow up in OP IR Clinic Please give pt Rx for flushes for home Should flush once daily record OP She will hear from scheduler for follow up time and date  Electronically Signed: , PA-C 11/09/2020, 9:53 AM   I spent a total of 15 Minutes at the the patient's bedside AND on the patient's hospital floor or unit, greater than 50% of which was counseling/coordinating care for RLQ abscess drain

## 2020-11-13 ENCOUNTER — Other Ambulatory Visit: Payer: Self-pay | Admitting: General Surgery

## 2020-11-13 DIAGNOSIS — K651 Peritoneal abscess: Secondary | ICD-10-CM

## 2020-11-16 ENCOUNTER — Encounter: Payer: Self-pay | Admitting: General Surgery

## 2020-11-16 ENCOUNTER — Ambulatory Visit (INDEPENDENT_AMBULATORY_CARE_PROVIDER_SITE_OTHER): Payer: Medicaid Other | Admitting: General Surgery

## 2020-11-16 ENCOUNTER — Other Ambulatory Visit: Payer: Self-pay

## 2020-11-16 VITALS — BP 84/63 | HR 104 | Temp 98.2°F | Resp 16 | Ht 64.0 in | Wt 203.0 lb

## 2020-11-16 DIAGNOSIS — K3532 Acute appendicitis with perforation and localized peritonitis, without abscess: Secondary | ICD-10-CM

## 2020-11-16 DIAGNOSIS — K651 Peritoneal abscess: Secondary | ICD-10-CM

## 2020-11-16 MED ORDER — ONDANSETRON 8 MG PO TBDP: 8.0000 mg | ORAL_TABLET | Freq: Three times a day (TID) | ORAL | 1 refills | Status: DC

## 2020-11-16 NOTE — Progress Notes (Signed)
[facility] Note   HPI:  44 y.o. Female presents to clinic for post-op follow-up evaluation after perforated appendicitis and subsequent abscess formation and IR. She is doing better and has recovered from her ileus. She had an IR drain placed. Both the operative drain and IR drain are putting about < 30cc of serous fluid daily. No fevers or chills.  Reviewed CT from IR and operative drain is inferior and will not cause any issues with IR drain if it is removed.   Review of Systems:  Constipation- last BM 1 week prior on Linzess  All other review of systems: otherwise negative   Vital Signs:  BP (!) 84/63   Pulse (!) 104   Temp 98.2 F (36.8 C) (Oral)   Resp 16   Ht 5\' 4"  (1.626 m)   Wt 203 lb (92.1 kg)   LMP  (LMP Unknown) Comment: Pregnancy Test Neg  SpO2 98%   BMI 34.84 kg/m    Physical Exam:  Physical Exam Vitals reviewed.  HENT:     Head: Normocephalic.  Cardiovascular:     Rate and Rhythm: Normal rate.  Pulmonary:     Effort: Pulmonary effort is normal.  Abdominal:     General: There is no distension.     Palpations: Abdomen is soft.     Tenderness: There is no abdominal tenderness.     Comments: Operative drain in the LLQ extended down into the pelvis removed and bandage placed, serous fluid in both drains   Neurological:     Mental Status: She is alert.     Assessment:  44 y.o. yo Female with perforated appendicitis s/p IR drain, ileus that resolved  Plan:  Diet and activity as tolerated. Keep flushing drain as instructed. Keep IR appt 11/29/2019. Call with concerns. Take suppository to get bowels going Zofran refilled   Future Appointments  Date Time Provider Department Center  11/28/2020 12:20 PM [facility] 1 [facility]  11/28/2020  1:00 PM [facility] DG 1 ([facility]) [facility]  11/28/2020  1:00 PM [facility] IR [facility]  11/30/2020  1:15 PM 12/02/2020, MD [facility] None   Lucretia Roers, MD [facility] [address] [place], [place] [phone] (office)

## 2020-11-16 NOTE — Patient Instructions (Signed)
Diet and activity as tolerated. Keep flushing drain as instructed. Keep IR appt 11/29/2019. Call with concerns. Take suppository to get bowels going.

## 2020-11-22 ENCOUNTER — Emergency Department (HOSPITAL_COMMUNITY)
Admission: EM | Admit: 2020-11-22 | Discharge: 2020-11-23 | Disposition: A | Discharge status: Home / Self Care | Payer: Medicaid Other | Attending: Emergency Medicine | Admitting: Emergency Medicine

## 2020-11-22 ENCOUNTER — Encounter (HOSPITAL_COMMUNITY): Payer: Self-pay

## 2020-11-22 ENCOUNTER — Emergency Department (HOSPITAL_COMMUNITY): Payer: Medicaid Other

## 2020-11-22 DIAGNOSIS — R000 Tachycardia, unspecified: Secondary | ICD-10-CM | POA: Diagnosis not present

## 2020-11-22 DIAGNOSIS — E039 Hypothyroidism, unspecified: Secondary | ICD-10-CM | POA: Diagnosis not present

## 2020-11-22 DIAGNOSIS — R112 Nausea with vomiting, unspecified: Secondary | ICD-10-CM | POA: Diagnosis present

## 2020-11-22 DIAGNOSIS — K529 Noninfective gastroenteritis and colitis, unspecified: Secondary | ICD-10-CM | POA: Insufficient documentation

## 2020-11-22 DIAGNOSIS — Z20822 Contact with and (suspected) exposure to covid-19: Secondary | ICD-10-CM | POA: Insufficient documentation

## 2020-11-22 DIAGNOSIS — I10 Essential (primary) hypertension: Secondary | ICD-10-CM | POA: Diagnosis not present

## 2020-11-22 DIAGNOSIS — F17210 Nicotine dependence, cigarettes, uncomplicated: Secondary | ICD-10-CM | POA: Diagnosis not present

## 2020-11-22 DIAGNOSIS — Z79899 Other long term (current) drug therapy: Secondary | ICD-10-CM | POA: Insufficient documentation

## 2020-11-22 LAB — COMPREHENSIVE METABOLIC PANEL
ALT: 14 U/L (ref 0–44)
AST: 20 U/L (ref 15–41)
Albumin: 3.2 g/dL — ABNORMAL LOW (ref 3.5–5.0)
Alkaline Phosphatase: 85 U/L (ref 38–126)
Anion gap: 14 (ref 5–15)
BUN: 9 mg/dL (ref 6–20)
CO2: 23 mmol/L (ref 22–32)
Calcium: 8.6 mg/dL — ABNORMAL LOW (ref 8.9–10.3)
Chloride: 95 mmol/L — ABNORMAL LOW (ref 98–111)
Creatinine, Ser: 1 mg/dL (ref 0.44–1.00)
GFR, Estimated: >60 mL/min (ref >60)
Glucose, Bld: 185 mg/dL — ABNORMAL HIGH (ref 70–99)
Potassium: 3.7 mmol/L (ref 3.5–5.1)
Sodium: 132 mmol/L — ABNORMAL LOW (ref 135–145)
Total Bilirubin: 0.8 mg/dL (ref 0.3–1.2)
Total Protein: 7.7 g/dL (ref 6.5–8.1)

## 2020-11-22 LAB — CBC
HCT: 43.6 % (ref 36.0–46.0)
Hemoglobin: 14.4 g/dL (ref 12.0–15.0)
MCH: 30.4 pg (ref 26.0–34.0)
MCHC: 33 g/dL (ref 30.0–36.0)
MCV: 92 fL (ref 80.0–100.0)
Platelets: 332 10*3/uL (ref 150–400)
RBC: 4.74 MIL/uL (ref 3.87–5.11)
RDW: 13.6 % (ref 11.5–15.5)
WBC: 8.2 10*3/uL (ref 4.0–10.5)
nRBC: 0 % (ref 0.0–0.2)

## 2020-11-22 LAB — LIPASE, BLOOD: Lipase: 20 U/L (ref 11–51)

## 2020-11-22 LAB — I-STAT BETA HCG BLOOD, ED (MC, WL, AP ONLY): I-stat hCG, quantitative: <5 m[IU]/mL (ref <5)

## 2020-11-22 MED ORDER — IOHEXOL 300 MG/ML  SOLN
100.0000 mL | Freq: Once | INTRAMUSCULAR | Status: AC | PRN
  Administered 2020-11-22: 100 mL via INTRAVENOUS

## 2020-11-22 MED ORDER — SODIUM CHLORIDE 0.9 % IV BOLUS
1000.0000 mL | Freq: Once | INTRAVENOUS | Status: AC
  Administered 2020-11-22: 1000 mL via INTRAVENOUS

## 2020-11-22 MED ORDER — ONDANSETRON HCL 4 MG/2ML IJ SOLN
4.0000 mg | Freq: Once | INTRAMUSCULAR | Status: AC
  Administered 2020-11-22: 4 mg via INTRAVENOUS
  Filled 2020-11-22: qty 2

## 2020-11-22 MED ORDER — ACETAMINOPHEN 325 MG PO TABS: 650.0000 mg | ORAL_TABLET | Freq: Once | ORAL | Status: DC | PRN

## 2020-11-22 NOTE — ED Provider Notes (Signed)
[facility] EMERGENCY DEPARTMENT Provider Note   CSN: [id number] Arrival date & time: 11/22/20  6144     History Chief Complaint  Patient presents with  . Emesis    Tawney Vanorman is a 45 y.o. female with a hx of recent appendectomy, bipolar 1 disorder, hypertensin, hypercholesterolemia, and hypothyroidism who presents to the ED with complaints of N/V that acutely worsened last night. Since her discharge from the hospital 11/09/20 she has been having decreased appetite, nausea, and intermittent mild emesis that is not daily. Yesterday she was feeling better therefore she ate a sausage egg and cheese croissant with subsequent nausea and approximately 20 episodes of emesis since intake. Has had some mild chills and nasal congestion with this. She reports her last BM was about 30 minutes ago and was loose which is normal for her with taking Linzess. She denies fevers at home, abdominal pain, hematemesis, melena, hematochezia, dysuria, frequency, vaginal discharge, chest pain, dyspnea, or cough. She states she is starting her period.   Per chart review for additional history and confirmation per discussion with the patient: she underwent appendectomy 10/30/20 by Dr. Lovell Sheehan- a drain was left in the abdomen due to findings of gangrene & perforation during surgery, she was discharged home 10/31/20 on Augmentin with subsequent ED return 11/01/20& admission for intractable N/V with ileus, placed on cefotetan. During admission was noted to have uptrending leukocytosis with subsequent repeat CT revealing abscesses leading to transfer to [facility] cone, transition to Zosyn and right sided drain placement by IR. Improvement throughout hospital course which lead to discharge on 10 days of cefdinir & flagyl which patient states she has completed & tolerated well. She reports she had her left sided drain removed by Dr. Henreitta Leber with general surgery recently and that her post op appointment went well, she  is scheduled to have IR remove right sided drain 11/28/20.     HPI     Past Medical History:  Diagnosis Date  . ADD (attention deficit disorder)   . Anxiety   . Bipolar 1 disorder (HCC)   . Depression   . HTN (hypertension)   . Hypercholesteremia   . Hypothyroidism     Patient Active Problem List   Diagnosis Date Noted  . Intra-abdominal abscess (HCC) 11/04/2020  . Hypokalemia 11/03/2020  . Ileus, postoperative (HCC) 11/01/2020  . Acute gangrenous cholecystitis 10/30/2020  . Acute gangrenous appendicitis with perforation and peritonitis   . Acute appendicitis 10/29/2020  . Abnormal LFTs 03/22/2019  . Constipation 03/22/2019  . Rectal bleeding 03/22/2019    Past Surgical History:  Procedure Laterality Date  . APPENDECTOMY    . CERVIX SURGERY    . COLONOSCOPY WITH PROPOFOL N/A 05/10/2019   Procedure: COLONOSCOPY WITH PROPOFOL;  Surgeon: Corbin Ade, MD;  Location: [facility] ENDO SUITE;  Service: Endoscopy;  Laterality: N/A;  2:30pm - pt does not want to move up  . HAND EXPLORATION Left    removal of glass from finger  . LAPAROSCOPIC APPENDECTOMY N/A 10/30/2020   Procedure: APPENDECTOMY LAPAROSCOPIC;  Surgeon: Franky Macho, MD;  Location: [facility] ORS;  Service: General;  Laterality: N/A;  . MANDIBLE SURGERY       OB History   No obstetric history on file.     Family History  Problem Relation Age of Onset  . Breast cancer Mother   . Liver disease Neg Hx   . Colon cancer Neg Hx     Social History   Tobacco Use  . Smoking status:  Current Every Day Smoker    Packs/day: 1.00    Years: 28.00    Pack years: 28.00    Types: Cigarettes  . Smokeless tobacco: Never Used  Vaping Use  . Vaping Use: Never used  Substance Use Topics  . Alcohol use: Yes  . Drug use: Not Currently    Home Medications Prior to Admission medications   Medication Sig Start Date End Date Taking? Authorizing Provider  acetaminophen (TYLENOL) 500 MG tablet Take 2 tablets (1,000 mg total) by  mouth every 6 (six) hours as needed for fever, headache or mild pain. 11/09/20   Drema Dallas, MD  cefdinir (OMNICEF) 300 MG capsule Take 1 capsule (300 mg total) by mouth every 12 (twelve) hours. 11/09/20   Drema Dallas, MD  desvenlafaxine (PRISTIQ) 100 MG 24 hr tablet Take 100 mg by mouth 2 (two) times daily.     [provider]  ibuprofen (ADVIL) 800 MG tablet Take 1 tablet (800 mg total) by mouth every 6 (six) hours as needed for mild pain (not relieved by tylenol). 11/09/20   Drema Dallas, MD  levothyroxine (SYNTHROID) 112 MCG tablet Take 112 mcg by mouth daily before breakfast.  01/19/19   [provider]  LINZESS 290 MCG CAPS capsule TAKE ONE (1) CAPSULE EACH DAY Patient taking differently: Take 290 mcg by mouth daily before breakfast. 10/08/19   Gelene Mink, NP  lisinopril-hydrochlorothiazide (ZESTORETIC) 20-12.5 MG tablet Take 1 tablet by mouth daily. 10/01/17   [provider]  metroNIDAZOLE (FLAGYL) 500 MG tablet Take 1 tablet (500 mg total) by mouth every 8 (eight) hours. 11/09/20   Drema Dallas, MD  ondansetron (ZOFRAN-ODT) 8 MG disintegrating tablet Take 1 tablet (8 mg total) by mouth 3 (three) times daily. 11/16/20   Lucretia Roers, MD  oxyCODONE-acetaminophen (PERCOCET) 5-325 MG tablet Take 1 tablet by mouth every 4 (four) hours as needed for severe pain. 10/31/20 10/31/21  Franky Macho, MD  QUEtiapine (SEROQUEL) 300 MG tablet Take 600 mg by mouth at bedtime.     [provider]  saccharomyces boulardii (FLORASTOR) 250 MG capsule Take 1 capsule (250 mg total) by mouth 2 (two) times daily. 11/09/20   Drema Dallas, MD  simethicone (MYLICON) 80 MG chewable tablet Chew 0.5 tablets (40 mg total) by mouth every 6 (six) hours as needed for flatulence (bloating). 11/09/20   Drema Dallas, MD  simvastatin (ZOCOR) 40 MG tablet Take 1 tablet (40 mg total) by mouth daily at 6 PM. 11/09/20   Drema Dallas, MD  traMADol (ULTRAM) 50 MG  tablet Take 1 tablet (50 mg total) by mouth every 6 (six) hours as needed for moderate pain. 11/09/20   Drema Dallas, MD  Vitamin D, Ergocalciferol, (DRISDOL) 1.25 MG (50000 UT) CAPS capsule Take 50,000 Units by mouth once a week. Saturdays 03/13/18   [provider]    Allergies    Patient has no known allergies.  Review of Systems   Review of Systems  Constitutional: Positive for appetite change and chills. Negative for fever.  HENT: Positive for congestion. Negative for ear pain and sore throat.   Respiratory: Negative for cough and shortness of breath.   Cardiovascular: Negative for chest pain.  Gastrointestinal: Positive for nausea and vomiting. Negative for abdominal pain, anal bleeding, blood in stool, constipation and diarrhea.  Genitourinary: Positive for vaginal bleeding (onse of menses). Negative for dysuria and vaginal discharge.  Neurological: Negative for syncope.  All other  systems reviewed and are negative.   Physical Exam Updated Vital Signs BP 124/71 (BP Location: Right Arm)   Pulse (!) 133 Comment: rn notified  Temp 99.6 F (37.6 C) (Oral)   Resp (!) 22   Ht 5\' 4"  (1.626 m)   Wt 90.7 kg   LMP  (LMP Unknown) Comment: Pregnancy Test Neg  SpO2 100%   BMI 34.33 kg/m   Physical Exam Vitals and nursing note reviewed.  Constitutional:      General: She is not in acute distress.    Appearance: She is well-developed. She is not toxic-appearing.  HENT:     Head: Normocephalic and atraumatic.  Eyes:     General:        Right eye: No discharge.        Left eye: No discharge.     Conjunctiva/sclera: Conjunctivae normal.  Cardiovascular:     Rate and Rhythm: Regular rhythm. Tachycardia present.  Pulmonary:     Effort: Pulmonary effort is normal. No respiratory distress.     Breath sounds: Normal breath sounds. No wheezing, rhonchi or rales.  Abdominal:     General: There is no distension.     Palpations: Abdomen is soft.     Tenderness: There is  abdominal tenderness (lower abdomen, periumbilical & epigastrium).     Comments: R sided JP drain in place. Mild clear to pink tinged small amount of fluid in drain. Left sided abdominal incision site with scabbing present, no significant purulent drainage or surrounding erythema.   Musculoskeletal:     Cervical back: Neck supple.  Skin:    General: Skin is warm and dry.     Findings: No rash.  Neurological:     Mental Status: She is alert.     Comments: Clear speech.   Psychiatric:        Behavior: Behavior normal.     ED Results / Procedures / Treatments   Labs (all labs ordered are listed, but only abnormal results are displayed) Labs Reviewed  COMPREHENSIVE METABOLIC PANEL - Abnormal; Notable for the following components:      Result Value   Sodium 132 (*)    Chloride 95 (*)    Glucose, Bld 185 (*)    Calcium 8.6 (*)    Albumin 3.2 (*)    All other components within normal limits  URINALYSIS, ROUTINE W REFLEX MICROSCOPIC - Abnormal; Notable for the following components:   Color, Urine AMBER (*)    APPearance CLOUDY (*)    Hgb urine dipstick LARGE (*)    Bilirubin Urine SMALL (*)    Ketones, ur 5 (*)    Protein, ur 100 (*)    Leukocytes,Ua TRACE (*)    Bacteria, UA FEW (*)    Squamous Epithelial / LPF >50 (*)    Non Squamous Epithelial 0-5 (*)    All other components within normal limits  RESP PANEL BY RT-PCR (FLU A&B, COVID) ARPGX2  LIPASE, BLOOD  CBC  I-STAT BETA HCG BLOOD, ED ([facility], [facility], [facility] ONLY)    EKG None  Radiology CT Abdomen Pelvis W Contrast  Result Date: 11/23/2020 CLINICAL DATA:  Emesis, recent appendectomy with indwelling surgical drain, diarrhea EXAM: CT ABDOMEN AND PELVIS WITH CONTRAST TECHNIQUE: Multidetector CT imaging of the abdomen and pelvis was performed using the standard protocol following bolus administration of intravenous contrast. CONTRAST:  01/21/2021 OMNIPAQUE IOHEXOL 300 MG/ML  SOLN COMPARISON:  11/06/2020, 11/04/2020 FINDINGS: Lower chest: No  acute pleural or parenchymal lung disease. Minimal  hypoventilatory changes left lower lobe. Hepatobiliary: No focal liver abnormality is seen. No gallstones, gallbladder wall thickening, or biliary dilatation. Pancreas: Unremarkable. No pancreatic ductal dilatation or surrounding inflammatory changes. Spleen: Normal in size without focal abnormality. Adrenals/Urinary Tract: Adrenal glands are unremarkable. Kidneys are normal, without renal calculi, focal lesion, or hydronephrosis. Bladder is unremarkable. Stomach/Bowel: There is no bowel obstruction. There is mild wall thickening throughout the jejunum, with scattered gas fluid levels, consistent with enteritis. Gas fluid levels within the colon are consistent with given history of diarrhea. Vascular/Lymphatic: Aortic atherosclerosis. No enlarged abdominal or pelvic lymph nodes. Reproductive: IUD within the uterus.  No adnexal masses. Other: Percutaneous drain coiled within the right mid abdomen. Inferior to the drain within the central lower abdomen is a 3.5 by 3.3 by 3.6 cm residual fluid collection. No significant rim enhancement or internal gas. This does not appear to communicate with the percutaneous drain. There is no free intra-abdominal gas.  No abdominal wall hernia. Musculoskeletal: No acute or destructive bony lesions. Reconstructed images demonstrate no additional findings. Chronic L3 compression deformity. IMPRESSION: 1. Residual 3.6 cm postoperative fluid collection within the lower central abdomen. This does not appear to communicate with the indwelling pigtail drainage catheter within the right mid abdomen. 2. Diffuse jejunal wall thickening with scattered gas fluid levels, compatible with enteritis. 3. Gas fluid levels throughout the colon consistent with given history of diarrhea. Electronically Signed   By: Sharlet Salina M.D.   On: 11/23/2020 00:11    Procedures Procedures (including critical care time)  Medications Ordered in  ED Medications  acetaminophen (TYLENOL) tablet 650 mg (has no administration in time range)    ED Course  I have reviewed the triage vital signs and the nursing notes.  Pertinent labs & imaging results that were available during my care of the patient were reviewed by me and considered in my medical decision making (see chart for details).    MDM Rules/Calculators/A&P                          Patient presents to the ED with complaints of nausea and vomiting too numerous times to count since eating a croissant sandwich last night.  She is nontoxic, her vitals notable for a fever with likely resultant tachycardia as well as contributing dehydration.  Her JP drain output is mildly pink-tinged, no significant purulent drainage or significantly bloody drainage.  She has some mild lower abdominal tenderness to palpation.  Additional history obtained:  Additional history obtained from chart review-per HPI as well as nursing note reviewed.. Previous records obtained and reviewed-complicated appendicitis postop course.  Lab Tests:  I Ordered, reviewed, and interpreted labs, which included:  CBC: No leukocytosis or significant anemia. CMP: Mild electrolyte abnormalities without significant derangement. Lipase: Within normal limits Pregnancy test: Negative Urinalysis: Contaminated, consistent with patient on her menstrual cycle, no urinary symptoms. Covid/influenza testing: Negative  In terms of her fever no urinary symptoms to suggest UTI especially with contaminated sample on her menstrual cycle.  She has no respiratory symptoms and has clear breath sounds, does not seem consistent with pneumonia.  Her Covid/flu test are negative.  No obvious dermatologic source  Imaging Studies ordered:  I ordered imaging studies which included CT abdomen/pelvis with contrast, I independently visualized and interpreted imaging which showed 1. Residual 3.6 cm postoperative fluid collection within the lower  central abdomen. This does not appear to communicate with the indwelling pigtail drainage catheter within the right  mid abdomen. 2. Diffuse jejunal wall thickening with scattered gas fluid levels, compatible with enteritis. 3. Gas fluid levels throughout the colon consistent with given history of diarrhea.  ED Course:  00:51: CONSULT: Discussed patient case with general surgeon Dr. Barry Dienes, relays her symptoms all could be related to enteritis and that the fluid collection could just be a postoperative fluid collection, however this could still be an abscess, reassuring that she does not have a leukocytosis and that the collection is not rim-enhancing, relays that it is reasonable to discharge the patient home if she is symptomatically improved and have her follow-up closely with Dr. Arnoldo Morale office.  Appreciate consultation.  Symptoms do overall seem consistent with enteritis, patient is feeling much better following antiemetics and fluids, she is tolerating p.o. without difficulty.  She does remain mildly tachycardic, however this is improving-on my assessment prior to discharge heart rate 110, she has been tachycardic at prior office visits, she states that she feels ready to go home.  Will discharge home with Zofran, we discussed the need to follow-up very closely with her general surgery group as well as very strict return precautions I discussed results, treatment plan, need for follow-up, and return precautions with the patient. Provided opportunity for questions, patient confirmed understanding and is in agreement with plan.   Findings and plan of care discussed with supervising physician Dr. Sedonia Small who is in agreement.   Portions of this note were generated with [employer]. Dictation errors may occur despite best attempts at proofreading.  Final Clinical Impression(s) / ED Diagnoses Final diagnoses:  Non-intractable vomiting with nausea, unspecified vomiting type  Enteritis    Rx  / DC Orders ED Discharge Orders         Ordered    ondansetron (ZOFRAN ODT) 4 MG disintegrating tablet  Every 8 hours PRN        11/23/20 0440           Kayda Allers, Glynda Jaeger, PA-C 11/23/20 0449    Maudie Flakes, MD 11/23/20 [id number]

## 2020-11-22 NOTE — ED Triage Notes (Addendum)
Pt reports continued emesis since 1am, recently had appendectomy last month, right sided JP drain still in place. Pt unable to keep anything down, denies any abd pain. Pt also reports multiple episodes of diarrhea this morning

## 2020-11-23 LAB — URINALYSIS, ROUTINE W REFLEX MICROSCOPIC
Glucose, UA: NEGATIVE mg/dL
Ketones, ur: 5 mg/dL — AB
Nitrite: NEGATIVE
Protein, ur: 100 mg/dL — AB
Specific Gravity, Urine: 1.024 (ref 1.005–1.030)
Squamous Epithelial / HPF: >50 — ABNORMAL HIGH (ref 0–5)
pH: 5 (ref 5.0–8.0)

## 2020-11-23 LAB — RESP PANEL BY RT-PCR (FLU A&B, COVID) ARPGX2
Influenza A by PCR: NEGATIVE
Influenza B by PCR: NEGATIVE
SARS Coronavirus 2 by RT PCR: NEGATIVE

## 2020-11-23 MED ORDER — ACETAMINOPHEN 325 MG PO TABS
650.0000 mg | ORAL_TABLET | Freq: Once | ORAL | Status: AC
  Administered 2020-11-23: 650 mg via ORAL
  Filled 2020-11-23: qty 2

## 2020-11-23 MED ORDER — SODIUM CHLORIDE 0.9 % IV BOLUS
1000.0000 mL | Freq: Once | INTRAVENOUS | Status: AC
  Administered 2020-11-23: 1000 mL via INTRAVENOUS

## 2020-11-23 MED ORDER — ONDANSETRON 4 MG PO TBDP: 4.0000 mg | ORAL_TABLET | Freq: Three times a day (TID) | ORAL | 0 refills | Status: DC | PRN

## 2020-11-23 MED ORDER — PROMETHAZINE HCL 25 MG/ML IJ SOLN
12.5000 mg | Freq: Once | INTRAMUSCULAR | Status: AC
  Administered 2020-11-23: 12.5 mg via INTRAVENOUS
  Filled 2020-11-23: qty 1

## 2020-11-23 NOTE — Discharge Instructions (Addendum)
You were seen in the emergency department today for nausea and vomiting.  Your labs show that your sodium was a bit low and your blood sugar was somewhat high, please have these rechecked by your primary care provider.  Your CT scan showed findings of enteritis which is likely from a viral GI illness, we suspect this is the primary cause of your symptoms.  You also have findings of a continued fluid collection in your lower abdomen, this may be from a postop issue or may be continued abscess, however your labs are overall reassuring in this regard.  We are sending you home with Zofran to take every hours as needed for nausea/vomiting.  We have prescribed you new medication(s) today. Discuss the medications prescribed today with your pharmacist as they can have adverse effects and interactions with your other medicines including over the counter and prescribed medications. Seek medical evaluation if you start to experience new or abnormal symptoms after taking one of these medicines, seek care immediately if you start to experience difficulty breathing, feeling of your throat closing, facial swelling, or rash as these could be indications of a more serious allergic reaction  We would like you to call Dr. Lovell Sheehan office for follow-up appoint within the next 48 hours for a recheck.  Return to the ER for new or worsening symptoms including but not limited to inability to keep fluids down, abdominal pain, new pain, fevers at home after 24 hours, blood in your stool, blood in your vomit, inability to have a bowel movement, inability to pass gas, or any other concerns.

## 2020-11-23 NOTE — ED Notes (Signed)
Pt tolerated PO challenge well, pt denies nausea at this time.

## 2020-11-24 ENCOUNTER — Other Ambulatory Visit: Payer: Self-pay | Admitting: Family Medicine

## 2020-11-24 DIAGNOSIS — K651 Peritoneal abscess: Secondary | ICD-10-CM

## 2020-11-24 MED ORDER — ONDANSETRON 8 MG PO TBDP: 8.0000 mg | ORAL_TABLET | Freq: Three times a day (TID) | ORAL | 0 refills | Status: AC | PRN

## 2020-11-28 ENCOUNTER — Encounter: Payer: Self-pay | Admitting: Radiology

## 2020-11-28 ENCOUNTER — Ambulatory Visit
Admission: RE | Admit: 2020-11-28 | Discharge: 2020-11-28 | Disposition: A | Discharge status: Home / Self Care | Payer: Medicaid Other | Source: Ambulatory Visit | Attending: Student | Admitting: Student

## 2020-11-28 ENCOUNTER — Ambulatory Visit
Admission: RE | Admit: 2020-11-28 | Discharge: 2020-11-28 | Disposition: A | Discharge status: Home / Self Care | Payer: Medicaid Other | Source: Ambulatory Visit | Attending: General Surgery | Admitting: General Surgery

## 2020-11-28 DIAGNOSIS — K651 Peritoneal abscess: Secondary | ICD-10-CM

## 2020-11-28 HISTORY — PX: IR RADIOLOGIST EVAL & MGMT: IMG5224

## 2020-11-28 MED ORDER — IOPAMIDOL (ISOVUE-300) INJECTION 61%
100.0000 mL | Freq: Once | INTRAVENOUS | Status: AC | PRN
  Administered 2020-11-28: 100 mL via INTRAVENOUS

## 2020-11-28 NOTE — Progress Notes (Signed)
Referring Physician(s): Dr. Algis Greenhouse  Chief Complaint: The patient is seen in follow up today s/p  right abdominal mesenteric abscess drain placed on 12.20.21  History of present illness:  45 y.o., female outpatient. history of  HTN, HLD, hypothyroidism, gangrenous and perforated appendix s/p laparoscopic appendectomy on 12.13.21 with surgical LLQ drain placement. Evelyn Simpson was found to have an ileus with post surgical right abdominal mesenteric abscess. IR placed an abscess drain on 12.20.21.  On 12.30.21 General Surgery removed their LLQ abscess drain in clinic. The Patient presented to the ED on 1.5.21 with nausea and vomiting. CT abdomen pelvis from 1.6.22 reads Residual 3.6 cm postoperative fluid collection within the lower central abdomen. This does not appear to communicate with the indwelling pigtail drainage catheter within the right mid abdomen. The CT shows resolution of right abdominal mesenteric abscess collection.  Evelyn Simpson presents today to the [facility] for [facility] evaluation. She reports flushing the abscess drain 10 ml with 5 ml of serous output daily. She denies nausea vomiting, fevers or chills and has completed her course of antibiotics. Follow up with General Surgery is scheduled for 1.13.22.   Past Medical History:  Diagnosis Date  . ADD (attention deficit disorder)   . Anxiety   . Bipolar 1 disorder (HCC)   . Depression   . HTN (hypertension)   . Hypercholesteremia   . Hypothyroidism     Past Surgical History:  Procedure Laterality Date  . APPENDECTOMY    . CERVIX SURGERY    . COLONOSCOPY WITH PROPOFOL N/A 05/10/2019   Procedure: COLONOSCOPY WITH PROPOFOL;  Surgeon: Corbin Ade, MD;  Location: [facility] SUITE;  Service: Endoscopy;  Laterality: N/A;  2:30pm - pt does not want to move up  . HAND EXPLORATION Left    removal of glass from finger  . LAPAROSCOPIC APPENDECTOMY N/A 10/30/2020   Procedure: APPENDECTOMY  LAPAROSCOPIC;  Surgeon: Franky Macho, MD;  Location: [facility] ORS;  Service: General;  Laterality: N/A;  . MANDIBLE SURGERY      Allergies: Patient has no known allergies.  Medications: Prior to Admission medications   Medication Sig Start Date End Date Taking? Authorizing Provider  acetaminophen (TYLENOL) 500 MG tablet Take 2 tablets (1,000 mg total) by mouth every 6 (six) hours as needed for fever, headache or mild pain. 11/09/20   Drema Dallas, MD  cefdinir (OMNICEF) 300 MG capsule Take 1 capsule (300 mg total) by mouth every 12 (twelve) hours. 11/09/20   Drema Dallas, MD  desvenlafaxine (PRISTIQ) 100 MG 24 hr tablet Take 100 mg by mouth 2 (two) times daily.     [provider]  ibuprofen (ADVIL) 800 MG tablet Take 1 tablet (800 mg total) by mouth every 6 (six) hours as needed for mild pain (not relieved by tylenol). 11/09/20   Drema Dallas, MD  levothyroxine (SYNTHROID) 112 MCG tablet Take 112 mcg by mouth daily before breakfast.  01/19/19   [provider]  LINZESS 290 MCG CAPS capsule TAKE ONE (1) CAPSULE EACH DAY Patient taking differently: Take 290 mcg by mouth daily before breakfast. 10/08/19   Gelene Mink, NP  lisinopril-hydrochlorothiazide (ZESTORETIC) 20-12.5 MG tablet Take 1 tablet by mouth daily. 10/01/17   [provider]  metroNIDAZOLE (FLAGYL) 500 MG tablet Take 1 tablet (500 mg total) by mouth every 8 (eight) hours. 11/09/20   Drema Dallas, MD  ondansetron (ZOFRAN ODT) 4 MG disintegrating tablet Take 1 tablet (4 mg total) by  mouth every 8 (eight) hours as needed for nausea or vomiting. 11/23/20   Petrucelli, Samantha R, PA-C  ondansetron (ZOFRAN-ODT) 8 MG disintegrating tablet Take 1 tablet (8 mg total) by mouth every 8 (eight) hours as needed for nausea or vomiting. 11/24/20   Lucretia Roers, MD  oxyCODONE-acetaminophen (PERCOCET) 5-325 MG tablet Take 1 tablet by mouth every 4 (four) hours as needed for severe pain. 10/31/20 10/31/21  Franky Macho, MD  QUEtiapine (SEROQUEL) 300 MG tablet Take 600 mg by mouth at bedtime.     [provider]  saccharomyces boulardii (FLORASTOR) 250 MG capsule Take 1 capsule (250 mg total) by mouth 2 (two) times daily. 11/09/20   Drema Dallas, MD  simethicone (MYLICON) 80 MG chewable tablet Chew 0.5 tablets (40 mg total) by mouth every 6 (six) hours as needed for flatulence (bloating). 11/09/20   Drema Dallas, MD  simvastatin (ZOCOR) 40 MG tablet Take 1 tablet (40 mg total) by mouth daily at 6 PM. 11/09/20   Drema Dallas, MD  traMADol (ULTRAM) 50 MG tablet Take 1 tablet (50 mg total) by mouth every 6 (six) hours as needed for moderate pain. 11/09/20   Drema Dallas, MD  Vitamin D, Ergocalciferol, (DRISDOL) 1.25 MG (50000 UT) CAPS capsule Take 50,000 Units by mouth once a week. Saturdays 03/13/18   [provider]     Family History  Problem Relation Age of Onset  . Breast cancer Mother   . Liver disease Neg Hx   . Colon cancer Neg Hx     Social History   Socioeconomic History  . Marital status: Legally Separated    Spouse name: Not on file  . Number of children: Not on file  . Years of education: Not on file  . Highest education level: Not on file  Occupational History  . Not on file  Tobacco Use  . Smoking status: Current Every Day Smoker    Packs/day: 1.00    Years: 28.00    Pack years: 28.00    Types: Cigarettes  . Smokeless tobacco: Never Used  Vaping Use  . Vaping Use: Never used  Substance and Sexual Activity  . Alcohol use: Yes  . Drug use: Not Currently  . Sexual activity: Not on file  Other Topics Concern  . Not on file  Social History Narrative  . Not on file   Social Determinants of Health   Financial Resource Strain: Not on file  Food Insecurity: Not on file  Transportation Needs: Not on file  Physical Activity: Not on file  Stress: Not on file  Social Connections: Not on file     Vital Signs: LMP  (LMP Unknown) Comment:  Pregnancy Test Neg  Physical Exam Vitals and nursing note reviewed.  Constitutional:      Appearance: She is well-developed.  HENT:     Head: Normocephalic and atraumatic.  Eyes:     Conjunctiva/sclera: Conjunctivae normal.  Pulmonary:     Effort: Pulmonary effort is normal.  Abdominal:     Comments: Positive right flank drain to suction.  Site is unremarkable with no erythema, edema, tenderness, bleeding or drainage noted at exit site. Suture and stat lock in place. Dressing is clean dry and intact. 2 ml of serous colored fluid noted in bulb suction device.    Musculoskeletal:        General: Normal range of motion.     Cervical back: Normal range of motion.  Skin:  General: Skin is warm.  Neurological:     Mental Status: She is alert and oriented to person, place, and time.     Imaging: No results found.  Labs:  CBC: Recent Labs    11/07/20 0432 11/08/20 0304 11/09/20 0146 11/22/20 0955  WBC 15.0* 12.7* 12.4* 8.2  HGB 10.5* 10.4* 10.1* 14.4  HCT 31.2* 31.2* 29.7* 43.6  PLT 357 374 382 332    COAGS: Recent Labs    11/06/20 1025  INR 1.2    BMP: Recent Labs    11/07/20 0432 11/08/20 0304 11/09/20 0146 11/22/20 0955  NA 137 138 138 132*  K 3.5 4.0 4.2 3.7  CL 105 107 107 95*  CO2 22 23 22 23   GLUCOSE 101* 107* 103* 185*  BUN 5* <5* <5* 9  CALCIUM 8.0* 8.2* 8.3* 8.6*  CREATININE 0.82 0.90 0.92 1.00  GFRNONAA >60 >60 >60 >60    LIVER FUNCTION TESTS: Recent Labs    11/07/20 0432 11/08/20 0304 11/09/20 0146 11/22/20 0955  BILITOT 0.4 0.7 0.5 0.8  AST 12* 12* 18 20  ALT 14 11 14 14   ALKPHOS 66 67 66 85  PROT 6.2* 6.4* 6.3* 7.7  ALBUMIN 2.0* 2.0* 2.1* 3.2*    Assessment:  45 y.o., female outpatient. history of  HTN, HLD, hypothyroidism, gangrenous and perforated appendix s/p laparoscopic appendectomy on 12.13.21 with surgical LLQ drain placement. Evelyn Simpson was found to have an ileus with post surgical right abdominal mesenteric abscess.  IR placed an abscess drain on 12.20.21.  On 12.30.21 General Surgery removed their LLQ abscess drain in clinic. The Patient presented to the ED on 1.5.21 with nausea and vomiting. CT abdomen pelvis from 1.6.22 reads Residual 3.6 cm postoperative fluid collection within the lower central abdomen. This does not appear to communicate with the indwelling pigtail drainage catheter within the right mid abdomen. The CT shows resolution of  right abdominal mesenteric abscess collection. Subsequent imaging obtained on 1.11.22 reviewed by Dr. 3.6.22.   Discussed case with Dr. 3.11.22 who recommends drain removal at this time.  Right mesenteric abdominal drain removed intact, no complications,  patient tolerated procedure well, dressing applied to exit site.   Post-removal instructions: 1- Okay  to shower/sponge bath 24 hours post-removal. 2- No submerging (swimming, bathing) for 7 days post-removal. 3- Change dressing PRN until site fully healed.    Signed: Corlis Leak, NP 11/28/2020, 12:34 PM   Please refer to Dr. Alene Mires attestation of this note for management and plan.

## 2020-11-30 ENCOUNTER — Ambulatory Visit (INDEPENDENT_AMBULATORY_CARE_PROVIDER_SITE_OTHER): Payer: Medicaid Other | Admitting: General Surgery

## 2020-11-30 ENCOUNTER — Other Ambulatory Visit: Payer: Self-pay

## 2020-11-30 ENCOUNTER — Encounter: Payer: Self-pay | Admitting: General Surgery

## 2020-11-30 VITALS — BP 122/79 | HR 108 | Temp 96.9°F | Resp 16 | Ht 64.0 in | Wt 196.0 lb

## 2020-11-30 DIAGNOSIS — R197 Diarrhea, unspecified: Secondary | ICD-10-CM

## 2020-11-30 NOTE — Progress Notes (Unsigned)
[facility] Note   HPI:  45 y.o. Female presents to clinic for follow-up evaluation after lap appy for perforated appendicitis complicated by ileus and abscess formation and IR drain placement. She has had her drain removed this week. Her CT did demonstrate some concern for possible evolving SBO. She has been having BMs with her Linzess and says they are watery. She is eating but only small meals. She is having some nausea.   Review of Systems:  Liquid Bms Nausea  Vomited once  All other review of systems: otherwise negative   Vital Signs:  BP 122/79   Pulse (!) 108   Temp (!) 96.9 F (36.1 C) (Other (Comment))   Resp 16   Ht 5\' 4"  (1.626 m)   Wt 196 lb (88.9 kg)   LMP 11/28/2020 Comment: Pregnancy Test Neg  SpO2 98%   BMI 33.64 kg/m    Physical Exam:  Physical Exam Vitals reviewed.  Cardiovascular:     Rate and Rhythm: Normal rate.  Pulmonary:     Effort: Pulmonary effort is normal.  Abdominal:     General: There is no distension.     Palpations: Abdomen is soft.     Tenderness: There is no abdominal tenderness.     Comments: Sites healing, no erythema or drainage  Musculoskeletal:        General: Normal range of motion.  Skin:    General: Skin is warm.      Imaging:  Personally reviewed CT and IR drain injection- CT with some dilated SB but no overt transition, small collection inferior to where drain was on CT, xray without abscess cavity or fistula  CLINICAL DATA:  Follow-up intra-abdominal abscess  EXAM: CT ABDOMEN AND PELVIS WITH CONTRAST  TECHNIQUE: Multidetector CT imaging of the abdomen and pelvis was performed using the standard protocol following bolus administration of intravenous contrast.  CONTRAST:  01/26/2021 ISOVUE-300 IOPAMIDOL (ISOVUE-300) INJECTION 61%  COMPARISON:  November 22, 2020  FINDINGS: Lower chest: The lung bases are clear. The heart size is normal.  Hepatobiliary: The liver is normal. Normal  gallbladder.There is no biliary ductal dilation.  Pancreas: Normal contours without ductal dilatation. No peripancreatic fluid collection.  Spleen: Unremarkable.  Adrenals/Urinary Tract:  --Adrenal glands: Unremarkable.  --Right kidney/ureter: No hydronephrosis or radiopaque kidney stones.  --Left kidney/ureter: No hydronephrosis or radiopaque kidney stones.  --Urinary bladder: Unremarkable.  Stomach/Bowel:  --Stomach/Duodenum: No hiatal hernia or other gastric abnormality. Normal duodenal course and caliber.  --Small bowel: There are dilated loops of small bowel throughout the abdomen demonstrating air-fluid levels. There appears to be a transition point in the right lower quadrant near the patient's surgical drain and residual 3 cm abscess.  --Colon: Unremarkable.  --Appendix: The appendix is not well appreciated.  Vascular/Lymphatic: Atherosclerotic calcification is present within the non-aneurysmal abdominal aorta, without hemodynamically significant stenosis.  --No retroperitoneal lymphadenopathy.  --No mesenteric lymphadenopathy.  --No pelvic or inguinal lymphadenopathy.  Reproductive: There is an IUD in place.  Other: No ascites or free air. A percutaneous drain is again noted in the right mid abdomen there does not appear to be a residual collection surrounding this drain.  Musculoskeletal. No acute displaced fractures.  IMPRESSION: 1. No significant residual collection surrounding the percutaneous drain. 2. Persistent 3 cm abscess in the right lower quadrant, similar to prior study. 3. Developing small bowel obstruction with a transition point in the right lower quadrant. 4.  Aortic Atherosclerosis (ICD10-I70.0).   Electronically Signed   By: November 24, 2020  M.D.   On: 11/28/2020 19:37  CLINICAL DATA:  Intra-abdominal abscess (HCC), post percutaneous drain catheter placement. CT demonstrates resolution of  collection.  EXAM: ABSCESS DRAIN CATHETER INJECTION UNDER FLUOROSCOPY  TECHNIQUE: The procedure, risks (including but not limited to bleeding, infection, organ damage), benefits, and alternatives were explained to the patient. Questions regarding the procedure were encouraged and answered. The patient understands and consents to the procedure.  Survey fluoroscopic inspection reveals stable position of pigtail drain catheter in the right mid abdomen.  Injection demonstrates minimal residual abscess cavity. No fistula to bowel.  IMPRESSION: 1. Resolution of abscess.  No fistula to bowel.   Electronically Signed   By: Corlis Leak M.D.   On: 11/29/2020 09:26  Assessment:  45 y.o. yo Female with history of perforated appendicitis and complication of abscess with ileus and IR drain. All drains are out now. Doing well overall but still with some crampy pain and watery diarrhea. She had antibiotics for an extended period of time.   Plan:  - C dif now to ensure no infection   - Monitor for signs of SBO, and go to hospital if vomiting and unable to tolerate diet  - Tylenol for pain   All of the above recommendations were discussed with the patient and patient's family, and all of patient's and family's questions were answered to their expressed satisfaction.  Future Appointments  Date Time Provider Department Center  12/07/2020  2:45 PM Lucretia Roers, MD [facility] None     Algis Greenhouse, MD [facility] [address] [place], [place] [address] [phone] (office)

## 2020-11-30 NOTE — Patient Instructions (Addendum)
Do stool sample. Go to Ed if vomiting or unable to tolerate diet or not having BMs. Tylenol 1000mg  every 6 hours for pain    https://www.cdc.gov/cdiff/index.html">  Clostridioides Difficile Infection Clostridioides difficile infection, also known as C. difficile or C. diff infection, happens when too much C. diff bacteria grows. This can cause severe diarrhea and inflammation of the colon (colitis). It is linked to recent use of antibiotic medicine. This infection can be passed from person to person (is contagious). You also may be exposed to the bacteria from contact with food, water, or surfaces that have the bacteria on them. What are the causes? Certain bacteria live in the colon and help to digest food. This infection develops when the balance of helpful bacteria in the colon changes and the C. diff bacteria grow out of control. This is often caused by taking antibiotics. What increases the risk? You may be more likely to develop this condition if you:  Take certain antibiotics that kill many types of bacteria or take antibiotics for a long time.  Have an extended stay in a hospital or long-term care facility.  Are older than age 34.  Have had a C. diff infection before or have been exposed to C. diff bacteria.  Have a weakened disease-fighting system (immune system).  Take a medicine to reduce stomach acid, such as a proton pump inhibitor, for a long time.  Have a serious underlying condition, such as colon cancer or inflammatory bowel disease (IBD).  Have had a gastrointestinal (GI) tract procedure. What are the signs or symptoms? Symptoms of this condition include:  Diarrhea (three or more times a day) for several days.  Fever.  Loss of appetite.  Nausea.  Swelling, pain, cramping, or tenderness in the abdomen. How is this diagnosed? This condition is diagnosed with:  Your medical history and a physical exam.  Tests, which may include: ? A test for C. diff in  your stool (feces). ? Blood tests. ? Imaging tests, such as a CT scan of your abdomen.  A procedure in which your colon is examined. This is rare. How is this treated? Treatment for this condition may include:  Stopping the antibiotics that you were taking when the C. diff infection began. Do this only as told by your health care provider.  Taking certain antibiotics to stop C. diff growth.  Taking stool from a healthy person and placing it into your colon (fecal transplant). This may be done if the infection keeps coming back.  Having surgery to remove the infected part of the colon. This is rare. Follow these instructions at home: Medicines  Take over-the-counter and prescription medicines only as told by your health care provider.  Take antibiotic medicine as told by your health care provider. Do not stop taking the antibiotic even if you start to feel better.  Do not treat diarrhea with medicines unless your health care provider tells you to. Eating and drinking  Follow instructions from your health care provider about eating and drinking restrictions.  Eat bland foods in small amounts that are easy to digest. These include bananas, applesauce, rice, lean meats, toast, and crackers.  Follow instructions on replacing body fluid that has been lost (rehydrate). This may include: ? Drinking clear fluids, such as water, clear fruit juice that is diluted with water, and low-calorie sports drinks. ? Sucking on ice chips. ? Taking an oral rehydration solution (ORS). This drink is sold at pharmacies and retail stores.  Avoid milk, caffeine, and  alcohol.  Drink enough fluid to keep your urine pale yellow.   General instructions  Wash your hands often with soap and water for at least 20 seconds. Bathe or shower using soap and water daily.  Return to your normal activities as told by your health care provider. Ask your health care provider what activities are safe for you.  Be sure  your home is clean before you leave the hospital or clinic to go home. Then continue daily cleaning for at least a week.  Keep all follow-up visits. This is important. How is this prevented? Hand hygiene  Wash your hands with soap and water for at least 20 seconds before preparing food and after using the bathroom. Make sure the people you live with also wash their hands often with soap and water for at least 20 seconds.  If you are being treated at a hospital or clinic, make sure that all health care providers and visitors wash their hands with soap and water before touching you.   Contact precautions  Tell your health care team right away if you develop diarrhea while in a hospital or long-term care facility.  When visiting someone in a hospital or a long-term care facility, follow guidelines for wearing a gown, gloves, or other protective equipment.  If possible, avoid contact with people who have diarrhea.  Use a separate bathroom if you are sick and live with other people, if possible. Clean environment  Clean surfaces that are touched often every day. C. diff bacteria are killed only by cleaning products that contain 10% chlorine bleach solution. Be sure to: ? Read the product's label to make sure the product will kill the bacteria on the surface you are cleaning. ? Clean frequently touched surfaces, such as toilet seats and flush handles, bathtubs, sinks, doorknobs, and work surfaces.  If you are in the hospital, make sure that staff members clean the surfaces in your room daily. Let a staff person know right away if body fluids have splashed or spilled. Washing clothes and linens  Use a powder laundry detergent containing chlorine bleach instead of liquid detergent. Powder detergents contain chlorine bleach in low levels to help kill bacteria.  Run your empty washing machine on the hot setting once a month with enough detergent for a full load. This will kill any remaining C.  diff bacteria. Contact a health care provider if:  Your symptoms do not get better, or they get worse, even with treatment.  Your symptoms go away and then come back.  You have a fever.  You develop new symptoms. Get help right away if:  You have more pain or tenderness in your abdomen.  You have stool that is mostly bloody, or looks black and tarry.  You cannot eat or drink without vomiting.  You have signs of dehydration, such as: ? Dark urine, very little urine, or no urine. ? Cracked lips or dry mouth. ? Not making tears when you cry. ? Sunken eyes. ? Sleepiness. ? Weakness or dizziness. Summary  Clostridioides difficile infection, or C. diff infection, can cause severe diarrhea and inflammation of the colon (colitis). It is linked to recent antibiotic use.  C. diff infection can spread from person to person (is contagious). You also may be exposed to the bacteria from contact with food, water, or surfaces that have the bacteria on them.  This infection may be treated by stopping the antibiotics you were using when the infection began. Fecal transplant or surgery may  be needed for repeat or severe infections.  Washing hands with soap and water for at least 20 seconds after you use the bathroom and before you eat, and cleaning surfaces with a 10% bleach solution, can help prevent or limit spread of this infection. This information is not intended to replace advice given to you by your health care provider. Make sure you discuss any questions you have with your health care provider. Document Revised: 02/24/2020 Document Reviewed: 02/24/2020 Elsevier Patient Education  2021 [employer]  Bowel Obstruction A bowel obstruction is a blockage in the small or large bowel. The bowel, which is also called the intestine, is a long, slender tube that connects the stomach to the anus. When a person eats and drinks, food and fluids go from the mouth to the stomach to the small bowel.  This is where most of the nutrients in the food and fluids are absorbed. After the small bowel, material passes through the large bowel for further absorption until any leftover material leaves the body as stool through the anus during a bowel movement. A bowel obstruction will prevent food and fluids from passing through the bowel as they normally do during digestion. The bowel can become partially or completely blocked. If this condition is not treated, it can be dangerous because the bowel could rupture. What are the causes? Common causes of this condition include:  Scar tissue (adhesions) from previous surgery or treatment with high-energy X-rays (radiation).  Recent surgery. This may cause the movements of the bowel to slow down and cause food to block the intestine.  Inflammatory bowel disease, such as Crohn's disease or diverticulitis.  Growths or tumors.  A bulging organ (hernia).  Twisting of the bowel (volvulus).  A foreign body.  Slipping of a part of the bowel into another part (intussusception).   What are the signs or symptoms? Symptoms of this condition include:  Pain in the abdomen. Depending on the degree of obstruction, pain may be: ? Mild or severe. ? Dull cramping or sharp pain. ? In one area or in the entire abdomen.  Nausea and vomiting. Vomit may be greenish or a yellow bile color.  Bloating in the abdomen.  Difficulty passing stool (constipation).  Lack of passing gas.  Frequent belching.  Diarrhea. This may occur if the obstruction is partial and runny stool is able to leak around the obstruction. How is this diagnosed? This condition may be diagnosed based on:  A physical exam.  Medical history.  Imaging tests of the abdomen or pelvis, such as X-ray or CT scan.  Blood or urine tests. How is this treated? Treatment for this condition depends on the cause and severity of the problem. Treatment may include:  Fluids and pain medicines that are  given through an IV. Your health care provider may instruct you not to eat or drink if you have nausea or vomiting.  Eating a simple diet. You may be asked to consume a clear liquid diet for several days. This allows the bowel to rest.  Placement of a small tube (nasogastric tube) into the stomach. This will relieve pain, discomfort, and nausea by removing blocked air and fluids from the stomach. It can also help the obstruction clear up faster.  Surgery. This may be required if other treatments do not work. Surgery may be required for: ? Bowel obstruction from a hernia. This can be an emergency procedure. ? Scar tissue that causes frequent or severe obstructions. Follow these instructions at home:  Medicines  Take over-the-counter and prescription medicines only as told by your health care provider.  If you were prescribed an antibiotic medicine, take it as told by your health care provider. Do not stop taking the antibiotic even if you start to feel better. General instructions  Follow instructions from your health care provider about eating restrictions. You may need to avoid solid foods and consume only clear liquids until your condition improves.  Return to your normal activities as told by your health care provider. Ask your health care provider what activities are safe for you.  Avoid sitting for a long time without moving. Get up to take short walks every 1-2 hours. This is important to improve blood flow and breathing. Ask for help if you feel weak or unsteady.  Keep all follow-up visits as told by your health care provider. This is important. How is this prevented? After having a bowel obstruction, you are more likely to have another. You may do the following things to prevent another obstruction:  If you have a long-term (chronic) disease, pay attention to your symptoms and contact your health care provider if you have questions or concerns.  Avoid becoming constipated. To  prevent or treat constipation, your health care provider may recommend that you: ? Drink enough fluid to keep your urine pale yellow. ? Take over-the-counter or prescription medicines. ? Eat foods that are high in fiber, such as beans, whole grains, and fresh fruits and vegetables. ? Limit foods that are high in fat and processed sugars, such as fried or sweet foods.  Stay active. Exercise for 30 minutes or more, 5 or more days each week. Ask your health care provider which exercises are safe for you.  Avoid stress. Find ways to reduce stress, such as meditation, exercise, or taking time for activities that relax you.  Instead of eating three large meals each day, eat three small meals with three small snacks.  Work with a Data processing manager to make a healthy meal plan that works for you.  Do not use any products that contain nicotine or tobacco, such as cigarettes and e-cigarettes. If you need help quitting, ask your health care provider.   Contact a health care provider if you:  Have a fever.  Have chills. Get help right away if you:  Have increased pain or cramping.  Vomit blood.  Have uncontrolled vomiting or nausea.  Cannot drink fluids because of vomiting or pain.  Become confused.  Begin feeling very thirsty (dehydrated).  Have severe bloating.  Feel extremely weak or you faint. Summary  A bowel obstruction is a blockage in the small or large bowel.  A bowel obstruction will prevent food and fluids from passing through the bowel as they normally do during digestion.  Treatment for this condition depends on the cause and severity of the problem. It may include fluids and pain medicines through an IV, a simple diet, a nasogastric tube, or surgery.  Follow instructions from your health care provider about eating restrictions. You may need to avoid solid foods and consume only clear liquids until your condition improves. This information is not intended to replace advice given  to you by your health care provider. Make sure you discuss any questions you have with your health care provider. Document Revised: 12/11/2018 Document Reviewed: 03/18/2018 Elsevier Patient Education  2021 [employer].

## 2020-12-06 LAB — C DIFFICILE, CYTOTOXIN B

## 2020-12-06 LAB — C DIFFICILE TOXINS A+B W/RFLX: C difficile Toxins A+B, EIA: NEGATIVE

## 2020-12-07 ENCOUNTER — Ambulatory Visit: Payer: Medicaid Other | Admitting: General Surgery

## 2020-12-08 ENCOUNTER — Telehealth: Payer: Self-pay | Admitting: Family Medicine

## 2020-12-08 NOTE — Telephone Encounter (Signed)
Pt's mother called inquiring about her c diff results. Results given and mother states, per patient, that she is doing well and does not think she needs to come in for another appointment.

## 2022-07-20 IMAGING — CT CT ABD-PELV W/ CM
2 of 5 series · 15 of 46 positions shown, 17 images · IV contrast (omnipaque)
Comparison: None.

CLINICAL DATA: Acute abdominal pain with nausea

EXAM:
CT ABDOMEN AND PELVIS WITH CONTRAST
TECHNIQUE: Multidetector CT imaging of the abdomen and pelvis was performed
using the standard protocol following bolus administration of
intravenous contrast.
CONTRAST:  100mL OMNIPAQUE IOHEXOL 300 MG/ML  SOLN

[Series 2: axial st · axial · 0.92mm/px · z∈[+557,+982]mm · 12 of 96 slices shown, 14 images]
[im 6/96  soft-tissue]
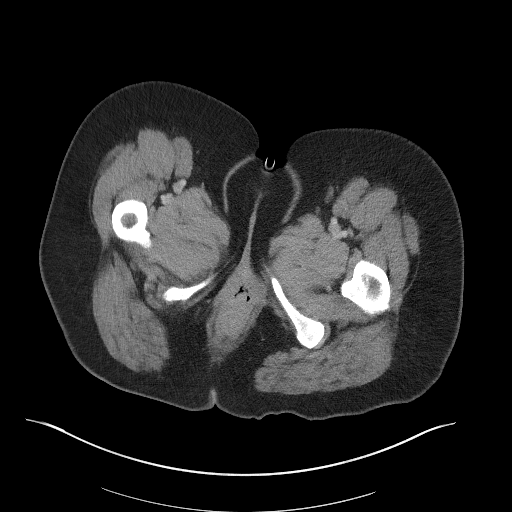
[im 6/96  bone]
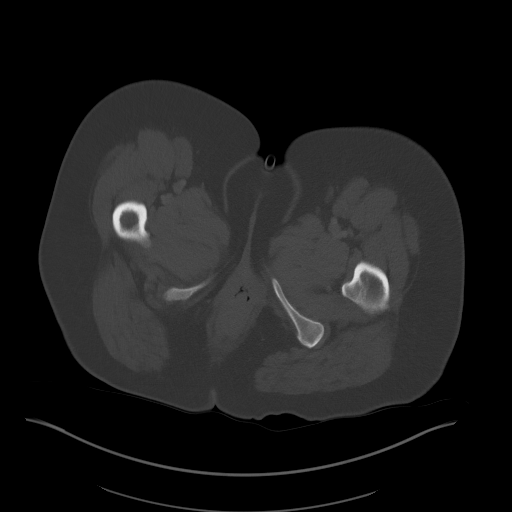
[im 16/96  soft-tissue]
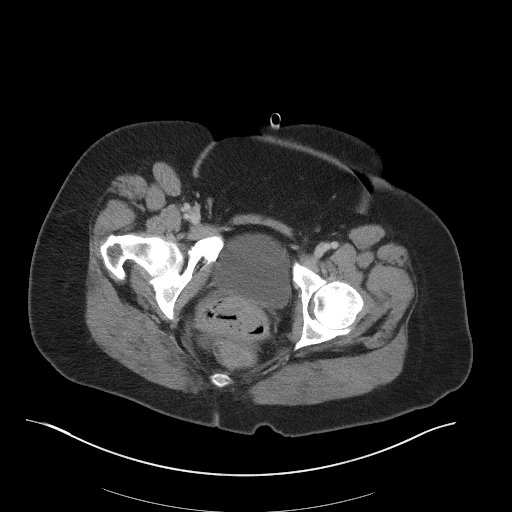
[im 21/96  soft-tissue]
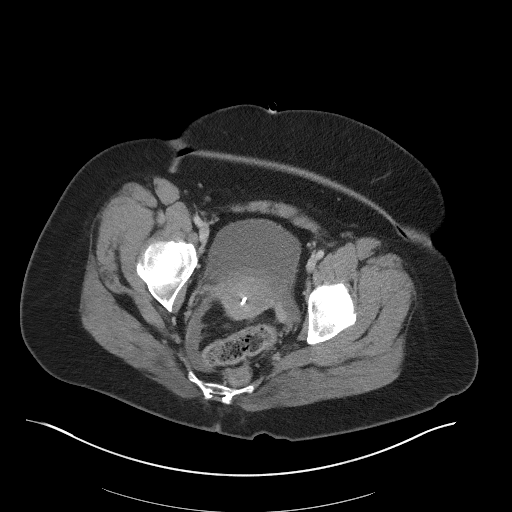
[im 31/96  soft-tissue]
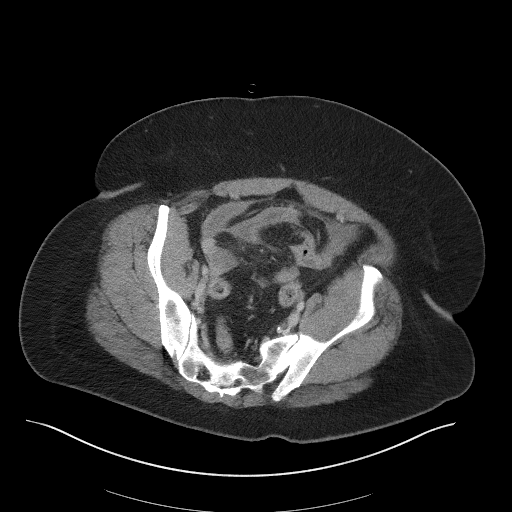
[im 36/96  soft-tissue]
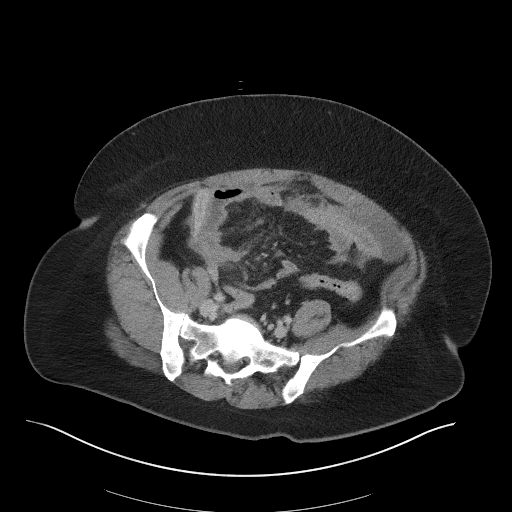
[im 46/96  soft-tissue]
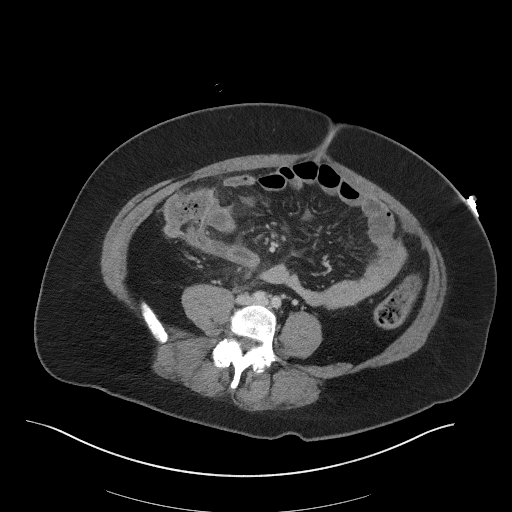
[im 51/96  soft-tissue]
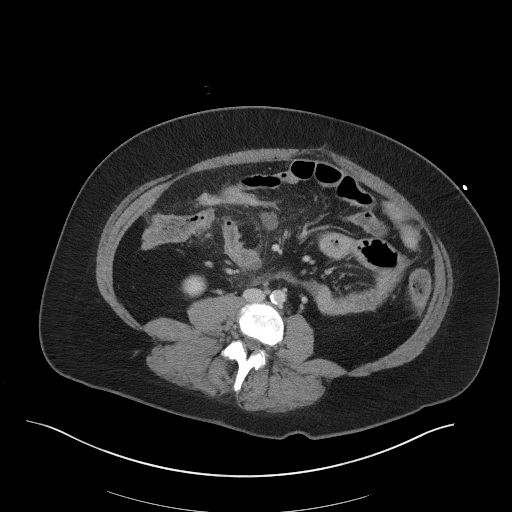
[im 61/96  soft-tissue]
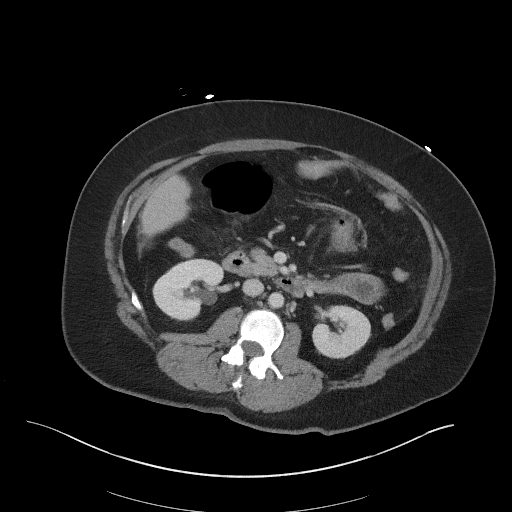
[im 66/96  soft-tissue]
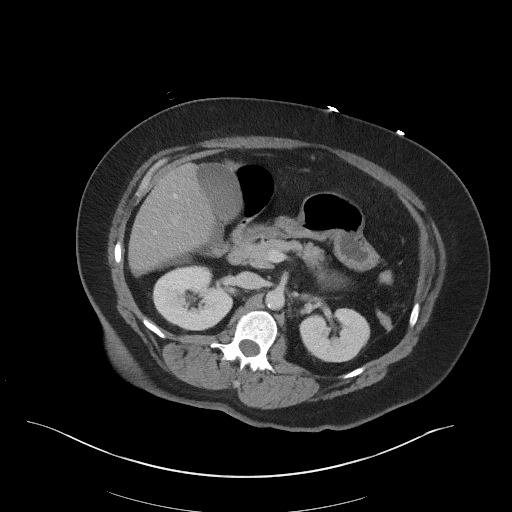
[im 66/96  bone]
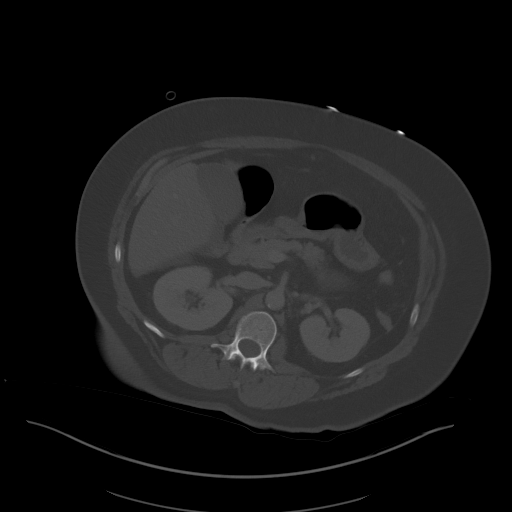
[im 76/96  soft-tissue]
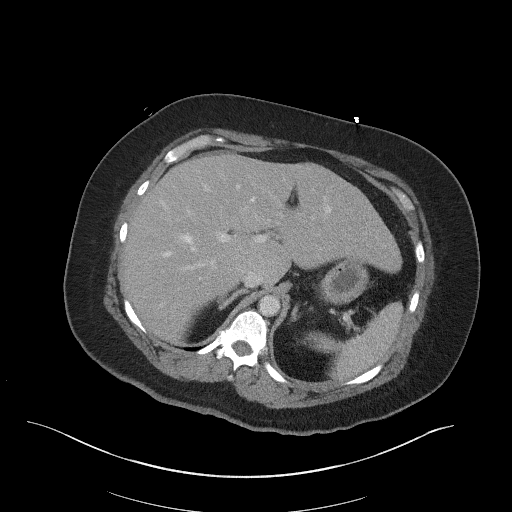
[im 81/96  soft-tissue]
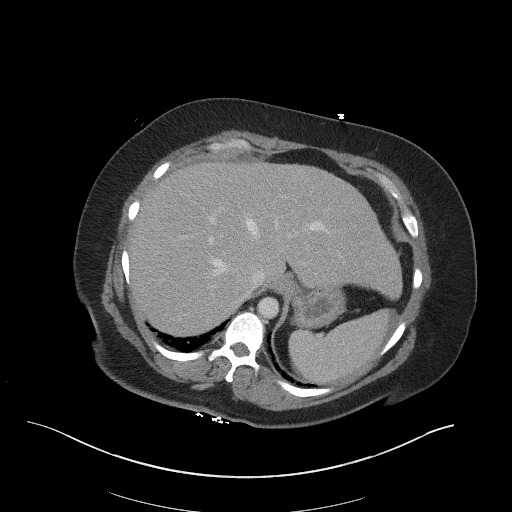
[im 91/96  soft-tissue]
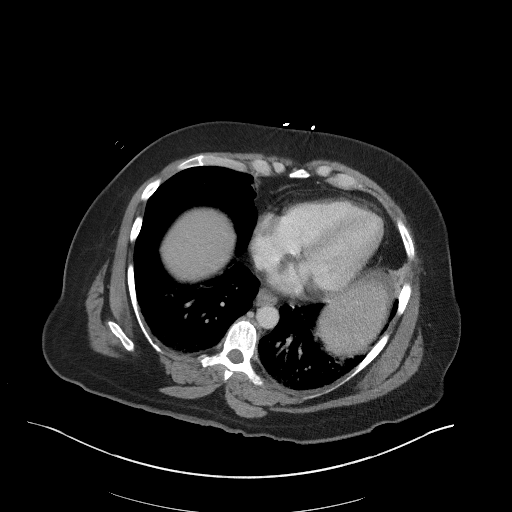

[Series 5: coronal st · coronal · 0.84mm/px · 3 of 112 slices shown]
[im 38/112  soft-tissue]
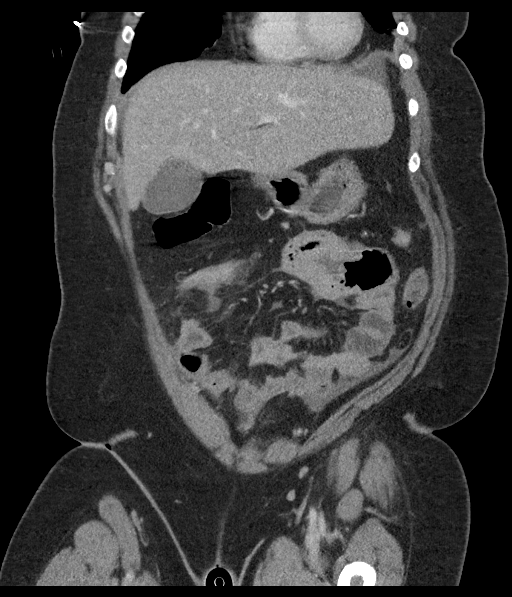
[im 50/112  soft-tissue]
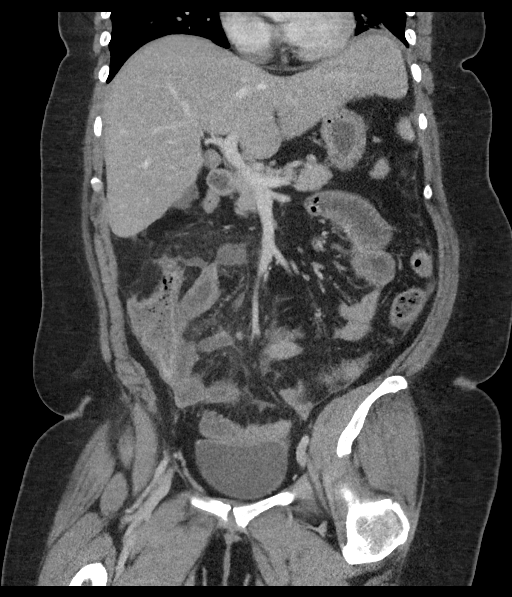
[im 62/112  soft-tissue]
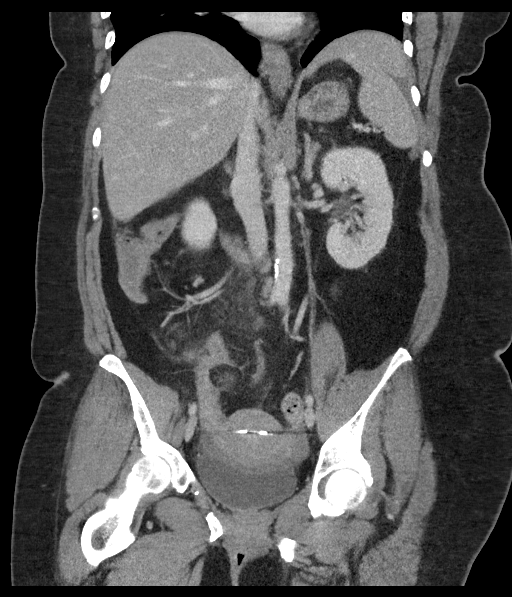

[15 of 46 positions shown; findings below may reference images not displayed]

FINDINGS: Lower chest: Bibasilar and lingula atelectasis. No pleural or
pericardial effusion. Normal heart size.

Hepatobiliary: No focal hepatic abnormality or biliary dilatation.
Hepatic and portal veins are patent. Gallbladder and biliary tree
unremarkable. Common bile duct nondilated. Trace perihepatic fluid
along the right inferior liver.

Pancreas: Unremarkable. No pancreatic ductal dilatation or
surrounding inflammatory changes.

Spleen: No focal abnormality. Normal size. Trace left upper quadrant
subdiaphragmatic and perisplenic free fluid.

Adrenals/Urinary Tract: Normal adrenal glands. Small bilateral
subcentimeter renal cysts. No renal obstruction or hydronephrosis.
No hydroureter or ureteral calculus. Bladder unremarkable.

Stomach/Bowel: Negative for bowel obstruction, significant
dilatation, or free air.

In the right lower quadrant, the appendix is dilated with fluid
distension, mucosal enhancement and small radiodense appendicoliths
compatible with appendicitis.

Additionally, several loops of mid and distal small bowel
demonstrate slight wall thickening and mucosal enhancement. Similar
wall thickening and enhancement of the cecum. Central mesentery
demonstrates diffuse edema/inflammation with a few scattered small
areas of mesenteric interloop free fluid. This remains nonspecific
but suggest a secondary associated enterocolitis/peritonitis
pattern.

Vascular/Lymphatic: Intact aorta. Infrarenal atherosclerotic change.
No aneurysm or occlusive process. No retroperitoneal hemorrhage or
hematoma. Mesenteric and renal vasculature appear to remain patent.
No Dassa process.

No bulky adenopathy.

Reproductive: IUD within the midline of the uterus endometrial
cavity. Uterus and adnexa normal in size. Trace pelvic free fluid as
well.

Other: Intact abdominal wall.

Musculoskeletal: Degenerative changes of the spine. Lower lumbar
facet arthropathy. No acute osseous finding.
IMPRESSION: Acute appendicitis with associated appendicoliths.

Diffuse mid and distal small bowel and cecal wall thickening with
mucosal enhancement as well as central mesentery edema and scattered
areas of mesenteric interloop free fluid. Appearance is compatible
with associated enterocolitis/peritonitis.

No discernible abscess, free air or evidence of perforation.

These results were called by telephone at the time of interpretation
on 10/29/2020 at [DATE] to provider MEELIS SIVEN , who verbally
acknowledged these results.
